# Patient Record
Sex: Male | Born: 1945 | Race: White | Hispanic: No | Marital: Married | Smoking: Never smoker
Health system: Southern US, Community
[De-identification: ages and names within clinical notes are randomized; demographics above are authoritative.]

## PROBLEM LIST (undated history)

## (undated) DIAGNOSIS — G20 Parkinson's disease: Secondary | ICD-10-CM

## (undated) DIAGNOSIS — I10 Essential (primary) hypertension: Secondary | ICD-10-CM

## (undated) DIAGNOSIS — G20A1 Parkinson's disease without dyskinesia, without mention of fluctuations: Secondary | ICD-10-CM

## (undated) HISTORY — PX: OTHER SURGICAL HISTORY: SHX169

## (undated) HISTORY — DX: Parkinson's disease: G20

## (undated) HISTORY — DX: Essential (primary) hypertension: I10

## (undated) HISTORY — PX: REPLACEMENT TOTAL KNEE BILATERAL: SUR1225

## (undated) HISTORY — PX: BILATERAL KNEE ARTHROSCOPY: SUR91

## (undated) HISTORY — DX: Parkinson's disease without dyskinesia, without mention of fluctuations: G20.A1

---

## 2010-07-08 ENCOUNTER — Encounter (HOSPITAL_COMMUNITY)
Admission: RE | Admit: 2010-07-08 | Discharge: 2010-07-08 | Disposition: A | Discharge status: Home / Self Care | Payer: Medicare Other | Source: Ambulatory Visit | Attending: Orthopedic Surgery | Admitting: Orthopedic Surgery

## 2010-07-08 ENCOUNTER — Other Ambulatory Visit (HOSPITAL_COMMUNITY): Payer: Self-pay | Admitting: Orthopedic Surgery

## 2010-07-08 DIAGNOSIS — M25552 Pain in left hip: Secondary | ICD-10-CM

## 2010-07-08 DIAGNOSIS — Z01818 Encounter for other preprocedural examination: Secondary | ICD-10-CM | POA: Insufficient documentation

## 2010-07-08 DIAGNOSIS — Z01812 Encounter for preprocedural laboratory examination: Secondary | ICD-10-CM | POA: Insufficient documentation

## 2010-07-08 LAB — URINALYSIS, ROUTINE W REFLEX MICROSCOPIC
Nitrite: NEGATIVE
Specific Gravity, Urine: 1.012 (ref 1.005–1.030)
Urobilinogen, UA: 0.2 mg/dL (ref 0.0–1.0)
pH: 6.5 (ref 5.0–8.0)

## 2010-07-08 LAB — COMPREHENSIVE METABOLIC PANEL
ALT: 20 U/L (ref 0–53)
AST: 18 U/L (ref 0–37)
Albumin: 4.2 g/dL (ref 3.5–5.2)
Calcium: 9.8 mg/dL (ref 8.4–10.5)
Creatinine, Ser: 0.99 mg/dL (ref 0.4–1.5)
GFR calc Af Amer: >60 mL/min (ref >60)
GFR calc non Af Amer: >60 mL/min (ref >60)
Sodium: 137 mEq/L (ref 135–145)
Total Protein: 7 g/dL (ref 6.0–8.3)

## 2010-07-08 LAB — CBC
MCV: 85.7 fL (ref 78.0–100.0)
Platelets: 289 10*3/uL (ref 150–400)
RBC: 4.9 MIL/uL (ref 4.22–5.81)
WBC: 7.4 10*3/uL (ref 4.0–10.5)

## 2010-07-08 LAB — DIFFERENTIAL
Eosinophils Absolute: 0.1 10*3/uL (ref 0.0–0.7)
Lymphs Abs: 1.7 10*3/uL (ref 0.7–4.0)
Neutrophils Relative %: 65 % (ref 43–77)

## 2010-07-08 LAB — TYPE AND SCREEN: ABO/RH(D): A POS

## 2010-07-08 LAB — PROTIME-INR: Prothrombin Time: 12.3 seconds (ref 11.6–15.2)

## 2010-07-08 LAB — SURGICAL PCR SCREEN: MRSA, PCR: NEGATIVE

## 2010-07-08 LAB — LIPID PANEL
Cholesterol: 217 mg/dL — ABNORMAL HIGH (ref 0–200)
HDL: 34 mg/dL — ABNORMAL LOW (ref >39)
LDL Cholesterol: 127 mg/dL — ABNORMAL HIGH (ref 0–99)
Triglycerides: 278 mg/dL — ABNORMAL HIGH (ref <150)

## 2010-07-08 LAB — APTT: aPTT: 28 seconds (ref 24–37)

## 2010-07-08 LAB — URINE MICROSCOPIC-ADD ON

## 2010-07-11 ENCOUNTER — Inpatient Hospital Stay (HOSPITAL_COMMUNITY)
Admission: RE | Admit: 2010-07-11 | Discharge: 2010-07-12 | DRG: 481 | Disposition: A | Discharge status: Home / Self Care | Payer: Medicare Other | Source: Ambulatory Visit | Attending: Orthopedic Surgery | Admitting: Orthopedic Surgery

## 2010-07-11 ENCOUNTER — Inpatient Hospital Stay (HOSPITAL_COMMUNITY): Payer: Medicare Other

## 2010-07-11 DIAGNOSIS — F3289 Other specified depressive episodes: Secondary | ICD-10-CM | POA: Diagnosis present

## 2010-07-11 DIAGNOSIS — T84498A Other mechanical complication of other internal orthopedic devices, implants and grafts, initial encounter: Principal | ICD-10-CM | POA: Diagnosis present

## 2010-07-11 DIAGNOSIS — F329 Major depressive disorder, single episode, unspecified: Secondary | ICD-10-CM | POA: Diagnosis present

## 2010-07-11 DIAGNOSIS — I10 Essential (primary) hypertension: Secondary | ICD-10-CM | POA: Diagnosis present

## 2010-07-11 DIAGNOSIS — Y838 Other surgical procedures as the cause of abnormal reaction of the patient, or of later complication, without mention of misadventure at the time of the procedure: Secondary | ICD-10-CM | POA: Diagnosis present

## 2010-07-11 DIAGNOSIS — G43909 Migraine, unspecified, not intractable, without status migrainosus: Secondary | ICD-10-CM | POA: Diagnosis present

## 2010-07-11 DIAGNOSIS — IMO0002 Reserved for concepts with insufficient information to code with codable children: Secondary | ICD-10-CM | POA: Diagnosis present

## 2010-07-11 LAB — CALCIUM, IONIZED: Calcium, Ion: 1.3 mmol/L (ref 1.12–1.32)

## 2010-07-11 LAB — GRAM STAIN

## 2010-07-12 LAB — VITAMIN D 25 HYDROXY (VIT D DEFICIENCY, FRACTURES): Vit D, 25-Hydroxy: 37 ng/mL (ref 30–89)

## 2010-07-12 LAB — BASIC METABOLIC PANEL
BUN: 7 mg/dL (ref 6–23)
CO2: 28 mEq/L (ref 19–32)
Calcium: 8.6 mg/dL (ref 8.4–10.5)
Glucose, Bld: 117 mg/dL — ABNORMAL HIGH (ref 70–99)
Sodium: 138 mEq/L (ref 135–145)

## 2010-07-12 LAB — CBC
HCT: 31.4 % — ABNORMAL LOW (ref 39.0–52.0)
Hemoglobin: 10.5 g/dL — ABNORMAL LOW (ref 13.0–17.0)
MCH: 28.8 pg (ref 26.0–34.0)
MCHC: 33.4 g/dL (ref 30.0–36.0)

## 2010-07-13 LAB — WOUND CULTURE

## 2010-07-16 LAB — ANAEROBIC CULTURE

## 2010-07-18 NOTE — Op Note (Signed)
[name], [name]                ACCOUNT NO.:  [id number]  MEDICAL RECORD NO.:  [id number]           PATIENT TYPE:  I  LOCATION:  5033                         FACILITY:  [facility]  PHYSICIAN:  Doralee Albino. Carola Frost, M.D. DATE OF BIRTH:  07-Mar-1946  DATE OF PROCEDURE:  07/11/2010 DATE OF DISCHARGE:                              OPERATIVE REPORT   PREOPERATIVE DIAGNOSES:  Left intertrochanteric nonunion, broken hardware.  POSTOPERATIVE DIAGNOSIS:  Left intertrochanteric nonunion, broken hardware.  PROCEDURE: 1. Open repair of left intertrochanteric nonunion with auto and     allografting. 2. IM nailing of the left femur and hip using a Synthes lateral entry     recon 13 mm x 400 mm statically locked. 3. Removal of broken implant, left femur.  SURGEON:  Doralee Albino. Carola Frost, MD  ASSISTANT:  Adrian Blackwater, RNFA  ANESTHESIA:  General.  THE FINDINGS:  Nonunion without gross instability.  SPECIMENS:  Two anaerobic, aerobic cultures from the nonunion site sent to micro.  FINDINGS:  No bacteria.  ESTIMATED BLOOD LOSS:  290 mL.  COMPLICATIONS:  None.  DISPOSITION:  PACU.  CONDITION:  Stable.  BRIEF SUMMARY AND INDICATIONS FOR PROCEDURE:  Matthew Morrow is a athletic 65 year old male status post and total knee arthroplasty who underwent repair of a left femoral neck fracture about 4 months ago.  He failed to progress and had acute exacerbation of pain about 3 weeks ago resulting in persistent elevated pain.  X-rays showed breakage of his proximal screw within the static portion of the short trochanteric nail.  There was also some windshield wipering or scalloping of the anterior cortex. We discussed with him the options for repair including conversion to a blade plate grafting alone, expected management, or conversion to a long nail, which would bypassed the area of damaged cortex, but could create a stress riser distally.  After full discussion, he did wish to proceed with the  lateral option understanding risks to include a supracondylar femur fracture, nerve injury, vessel injury, persistent nonunion of the hip, symptomatic hardware, DVT, PE, heart attack, stroke, and multiple others.  DESCRIPTION OF PROCEDURE:  Matthew Morrow was administered preoperative antibiotics and taken to the operating room where general anesthesia was induced.  He was positioned on the fracture table with his left leg outstretched, all prominences padded appropriately, the well leg in the elevated position.  The old incision was remade about the hip after confirming appropriate position on C-arm.  I did not use the entire proximal incision.  Dissection was carried down to the tip of the nail. The locking screw within the nail was partially withdrawn.  The lag screw engaged through the old incision more distal and partially withdrawn.  I then engaged the distal lock and withdrew that.  I then used another instrument as a tamp to slightly impact the screws so that the nail could be withdrawn.  Broken hardware did increase the difficulty and operative time required.  Furthermore, there was extensive bone overgrowth completely covering the lag screw into the femoral head laterally.  This was followed by eventual extraction and sequential reaming.  We passed the 13  without any chatter, but the higher but larger reamer heads encountered the screw.  Consequently, we selected a 13 nail x 400, so that it would go past the prosthesis at the knee and reduce the chance for fracture.  Prior to this after removal of the implant, we then extended the lag screw incision and more directly exposed the nonunion site.  Here, I collected extensive autograft from the lateral cortex including exuberant bone over the lag screw.  This was followed with extensive allografting into the lag screw hole using 80 mL of bone.  Graft was furthermore placed anteriorly and posteriorly within the canal checking on C-arm  and impacting it with a bone tamp all along the areas of nonunion.  Following this, attention was turned to nailing of the femur to add additional support.  Again as described above sequentially reamed and then the nail inserted carefully past the broken medial screw tip, passed down into the distal aspect of the knee making sure that the bow was appropriate to avoid fracture of the anterior cortex.  This was followed by placement of the two lag screws into the femoral head achieving outstanding purchase with the inferior one along the calcar and then a solid purchase with the more central screw.  I did partially encounter the old canal from the previous implant.  I did swing him into a significant valgus to improve the alignment at the hip to further increase biomechanical environment for healing.  Perfect circle technique was used to place the most distal screw in the static hole.  All wounds were irrigated thoroughly and closed in standard layered fashion again placing more allograft laterally around the area of previous lag screw that had to be removed. The patient was then awakened from anesthesia and transported to the PACU in stable condition following layered closure.  Adrian Blackwater, RNFA assisted me throughout the procedure.  PROGNOSIS:  Matthew Morrow will be weightbearing as tolerated on the left lower extremity was no motion, restrictions of the knee or hip.  We will add DVT prophylaxis while in the hospital and will be discharged on a short 10-day course.     Doralee Albino. Carola Frost, M.D.     [facility]MED[facility]  D:  07/11/2010  T:  07/12/2010  Job:  [id number]  Electronically Signed by Myrene Galas M.D. on 07/18/2010 07:54:00 AM

## 2011-06-03 DIAGNOSIS — D126 Benign neoplasm of colon, unspecified: Secondary | ICD-10-CM | POA: Diagnosis not present

## 2011-06-03 DIAGNOSIS — Z1211 Encounter for screening for malignant neoplasm of colon: Secondary | ICD-10-CM | POA: Diagnosis not present

## 2011-12-31 DIAGNOSIS — F329 Major depressive disorder, single episode, unspecified: Secondary | ICD-10-CM | POA: Diagnosis not present

## 2011-12-31 DIAGNOSIS — I10 Essential (primary) hypertension: Secondary | ICD-10-CM | POA: Diagnosis not present

## 2012-01-21 DIAGNOSIS — H9190 Unspecified hearing loss, unspecified ear: Secondary | ICD-10-CM | POA: Diagnosis not present

## 2012-01-23 DIAGNOSIS — H60399 Other infective otitis externa, unspecified ear: Secondary | ICD-10-CM | POA: Diagnosis not present

## 2012-01-23 DIAGNOSIS — H61309 Acquired stenosis of external ear canal, unspecified, unspecified ear: Secondary | ICD-10-CM | POA: Diagnosis not present

## 2012-01-23 DIAGNOSIS — H9209 Otalgia, unspecified ear: Secondary | ICD-10-CM | POA: Diagnosis not present

## 2012-02-06 DIAGNOSIS — H60399 Other infective otitis externa, unspecified ear: Secondary | ICD-10-CM | POA: Diagnosis not present

## 2012-02-06 DIAGNOSIS — H9209 Otalgia, unspecified ear: Secondary | ICD-10-CM | POA: Diagnosis not present

## 2012-04-30 DIAGNOSIS — G43709 Chronic migraine without aura, not intractable, without status migrainosus: Secondary | ICD-10-CM | POA: Diagnosis not present

## 2012-04-30 DIAGNOSIS — Z79899 Other long term (current) drug therapy: Secondary | ICD-10-CM | POA: Diagnosis not present

## 2012-04-30 DIAGNOSIS — Z125 Encounter for screening for malignant neoplasm of prostate: Secondary | ICD-10-CM | POA: Diagnosis not present

## 2012-04-30 DIAGNOSIS — I10 Essential (primary) hypertension: Secondary | ICD-10-CM | POA: Diagnosis not present

## 2012-04-30 DIAGNOSIS — M1990 Unspecified osteoarthritis, unspecified site: Secondary | ICD-10-CM | POA: Diagnosis not present

## 2012-12-10 DIAGNOSIS — F0280 Dementia in other diseases classified elsewhere without behavioral disturbance: Secondary | ICD-10-CM | POA: Diagnosis not present

## 2012-12-10 DIAGNOSIS — R259 Unspecified abnormal involuntary movements: Secondary | ICD-10-CM | POA: Diagnosis not present

## 2013-01-04 DIAGNOSIS — K645 Perianal venous thrombosis: Secondary | ICD-10-CM | POA: Diagnosis not present

## 2013-02-07 DIAGNOSIS — F0280 Dementia in other diseases classified elsewhere without behavioral disturbance: Secondary | ICD-10-CM | POA: Diagnosis not present

## 2013-02-07 DIAGNOSIS — R259 Unspecified abnormal involuntary movements: Secondary | ICD-10-CM | POA: Diagnosis not present

## 2013-02-09 DIAGNOSIS — Z23 Encounter for immunization: Secondary | ICD-10-CM | POA: Diagnosis not present

## 2013-03-25 DIAGNOSIS — I10 Essential (primary) hypertension: Secondary | ICD-10-CM | POA: Diagnosis not present

## 2013-03-25 DIAGNOSIS — M1990 Unspecified osteoarthritis, unspecified site: Secondary | ICD-10-CM | POA: Diagnosis not present

## 2013-03-25 DIAGNOSIS — Z0000 Encounter for general adult medical examination without abnormal findings: Secondary | ICD-10-CM | POA: Diagnosis not present

## 2013-05-20 DIAGNOSIS — I10 Essential (primary) hypertension: Secondary | ICD-10-CM | POA: Diagnosis not present

## 2013-05-20 DIAGNOSIS — Z125 Encounter for screening for malignant neoplasm of prostate: Secondary | ICD-10-CM | POA: Diagnosis not present

## 2013-05-20 DIAGNOSIS — D485 Neoplasm of uncertain behavior of skin: Secondary | ICD-10-CM | POA: Diagnosis not present

## 2013-05-20 DIAGNOSIS — G309 Alzheimer's disease, unspecified: Secondary | ICD-10-CM | POA: Diagnosis not present

## 2013-05-20 DIAGNOSIS — F0280 Dementia in other diseases classified elsewhere without behavioral disturbance: Secondary | ICD-10-CM | POA: Diagnosis not present

## 2013-05-20 DIAGNOSIS — E7800 Pure hypercholesterolemia, unspecified: Secondary | ICD-10-CM | POA: Diagnosis not present

## 2013-06-02 DIAGNOSIS — L570 Actinic keratosis: Secondary | ICD-10-CM | POA: Diagnosis not present

## 2013-06-02 DIAGNOSIS — C44320 Squamous cell carcinoma of skin of unspecified parts of face: Secondary | ICD-10-CM | POA: Diagnosis not present

## 2013-07-06 DIAGNOSIS — C44320 Squamous cell carcinoma of skin of unspecified parts of face: Secondary | ICD-10-CM | POA: Diagnosis not present

## 2013-07-12 DIAGNOSIS — M19019 Primary osteoarthritis, unspecified shoulder: Secondary | ICD-10-CM | POA: Diagnosis not present

## 2013-07-12 DIAGNOSIS — M25519 Pain in unspecified shoulder: Secondary | ICD-10-CM | POA: Diagnosis not present

## 2013-08-12 DIAGNOSIS — F0280 Dementia in other diseases classified elsewhere without behavioral disturbance: Secondary | ICD-10-CM | POA: Diagnosis not present

## 2013-08-12 DIAGNOSIS — G309 Alzheimer's disease, unspecified: Secondary | ICD-10-CM | POA: Diagnosis not present

## 2013-08-12 DIAGNOSIS — R259 Unspecified abnormal involuntary movements: Secondary | ICD-10-CM | POA: Diagnosis not present

## 2013-08-16 DIAGNOSIS — M19019 Primary osteoarthritis, unspecified shoulder: Secondary | ICD-10-CM | POA: Diagnosis not present

## 2013-09-29 DIAGNOSIS — G20 Parkinson's disease: Secondary | ICD-10-CM | POA: Diagnosis not present

## 2013-09-29 DIAGNOSIS — G3184 Mild cognitive impairment, so stated: Secondary | ICD-10-CM | POA: Diagnosis not present

## 2013-09-29 DIAGNOSIS — R413 Other amnesia: Secondary | ICD-10-CM | POA: Diagnosis not present

## 2013-10-11 DIAGNOSIS — L570 Actinic keratosis: Secondary | ICD-10-CM | POA: Diagnosis not present

## 2013-10-13 DIAGNOSIS — D518 Other vitamin B12 deficiency anemias: Secondary | ICD-10-CM | POA: Diagnosis not present

## 2013-10-16 DIAGNOSIS — G3184 Mild cognitive impairment, so stated: Secondary | ICD-10-CM | POA: Insufficient documentation

## 2013-10-16 DIAGNOSIS — R259 Unspecified abnormal involuntary movements: Secondary | ICD-10-CM

## 2013-10-16 DIAGNOSIS — I10 Essential (primary) hypertension: Secondary | ICD-10-CM

## 2013-10-16 HISTORY — DX: Essential (primary) hypertension: I10

## 2013-10-16 HISTORY — DX: Unspecified abnormal involuntary movements: R25.9

## 2013-10-16 HISTORY — DX: Mild cognitive impairment of uncertain or unknown etiology: G31.84

## 2013-10-20 DIAGNOSIS — D510 Vitamin B12 deficiency anemia due to intrinsic factor deficiency: Secondary | ICD-10-CM | POA: Diagnosis not present

## 2013-10-26 DIAGNOSIS — D518 Other vitamin B12 deficiency anemias: Secondary | ICD-10-CM | POA: Diagnosis not present

## 2013-11-01 DIAGNOSIS — M19019 Primary osteoarthritis, unspecified shoulder: Secondary | ICD-10-CM | POA: Diagnosis not present

## 2013-11-01 DIAGNOSIS — M25519 Pain in unspecified shoulder: Secondary | ICD-10-CM | POA: Diagnosis not present

## 2013-11-02 DIAGNOSIS — D518 Other vitamin B12 deficiency anemias: Secondary | ICD-10-CM | POA: Diagnosis not present

## 2013-11-09 DIAGNOSIS — D510 Vitamin B12 deficiency anemia due to intrinsic factor deficiency: Secondary | ICD-10-CM | POA: Diagnosis not present

## 2013-11-17 DIAGNOSIS — D510 Vitamin B12 deficiency anemia due to intrinsic factor deficiency: Secondary | ICD-10-CM | POA: Diagnosis not present

## 2013-11-30 DIAGNOSIS — D510 Vitamin B12 deficiency anemia due to intrinsic factor deficiency: Secondary | ICD-10-CM | POA: Diagnosis not present

## 2014-01-02 DIAGNOSIS — D510 Vitamin B12 deficiency anemia due to intrinsic factor deficiency: Secondary | ICD-10-CM | POA: Diagnosis not present

## 2014-01-19 DIAGNOSIS — D510 Vitamin B12 deficiency anemia due to intrinsic factor deficiency: Secondary | ICD-10-CM | POA: Diagnosis not present

## 2014-02-15 DIAGNOSIS — D510 Vitamin B12 deficiency anemia due to intrinsic factor deficiency: Secondary | ICD-10-CM | POA: Diagnosis not present

## 2014-02-28 DIAGNOSIS — Z23 Encounter for immunization: Secondary | ICD-10-CM | POA: Diagnosis not present

## 2014-02-28 DIAGNOSIS — D510 Vitamin B12 deficiency anemia due to intrinsic factor deficiency: Secondary | ICD-10-CM | POA: Diagnosis not present

## 2014-03-02 DIAGNOSIS — G20 Parkinson's disease: Secondary | ICD-10-CM | POA: Diagnosis not present

## 2014-03-02 DIAGNOSIS — G3184 Mild cognitive impairment, so stated: Secondary | ICD-10-CM | POA: Diagnosis not present

## 2014-03-29 DIAGNOSIS — D510 Vitamin B12 deficiency anemia due to intrinsic factor deficiency: Secondary | ICD-10-CM | POA: Diagnosis not present

## 2014-04-07 DIAGNOSIS — G20 Parkinson's disease: Secondary | ICD-10-CM | POA: Diagnosis not present

## 2014-04-07 DIAGNOSIS — G3184 Mild cognitive impairment, so stated: Secondary | ICD-10-CM | POA: Diagnosis not present

## 2014-04-11 DIAGNOSIS — C4442 Squamous cell carcinoma of skin of scalp and neck: Secondary | ICD-10-CM | POA: Diagnosis not present

## 2014-04-21 DIAGNOSIS — D510 Vitamin B12 deficiency anemia due to intrinsic factor deficiency: Secondary | ICD-10-CM | POA: Diagnosis not present

## 2014-04-28 DIAGNOSIS — G219 Secondary parkinsonism, unspecified: Secondary | ICD-10-CM | POA: Diagnosis not present

## 2014-05-17 DIAGNOSIS — D510 Vitamin B12 deficiency anemia due to intrinsic factor deficiency: Secondary | ICD-10-CM | POA: Diagnosis not present

## 2014-05-26 DIAGNOSIS — K529 Noninfective gastroenteritis and colitis, unspecified: Secondary | ICD-10-CM | POA: Diagnosis not present

## 2014-06-16 DIAGNOSIS — Z1211 Encounter for screening for malignant neoplasm of colon: Secondary | ICD-10-CM | POA: Diagnosis not present

## 2014-06-16 DIAGNOSIS — Z86010 Personal history of colonic polyps: Secondary | ICD-10-CM | POA: Diagnosis not present

## 2014-06-16 DIAGNOSIS — R194 Change in bowel habit: Secondary | ICD-10-CM | POA: Diagnosis not present

## 2014-06-22 DIAGNOSIS — D510 Vitamin B12 deficiency anemia due to intrinsic factor deficiency: Secondary | ICD-10-CM | POA: Diagnosis not present

## 2014-07-11 DIAGNOSIS — D510 Vitamin B12 deficiency anemia due to intrinsic factor deficiency: Secondary | ICD-10-CM | POA: Diagnosis not present

## 2014-07-28 DIAGNOSIS — D510 Vitamin B12 deficiency anemia due to intrinsic factor deficiency: Secondary | ICD-10-CM | POA: Diagnosis not present

## 2014-08-16 DIAGNOSIS — M19019 Primary osteoarthritis, unspecified shoulder: Secondary | ICD-10-CM | POA: Diagnosis not present

## 2014-08-18 DIAGNOSIS — D510 Vitamin B12 deficiency anemia due to intrinsic factor deficiency: Secondary | ICD-10-CM | POA: Diagnosis not present

## 2014-08-21 DIAGNOSIS — R5383 Other fatigue: Secondary | ICD-10-CM | POA: Diagnosis not present

## 2014-08-21 DIAGNOSIS — D509 Iron deficiency anemia, unspecified: Secondary | ICD-10-CM | POA: Diagnosis not present

## 2014-08-21 DIAGNOSIS — I10 Essential (primary) hypertension: Secondary | ICD-10-CM | POA: Diagnosis not present

## 2014-08-21 DIAGNOSIS — Z125 Encounter for screening for malignant neoplasm of prostate: Secondary | ICD-10-CM | POA: Diagnosis not present

## 2014-08-21 DIAGNOSIS — F419 Anxiety disorder, unspecified: Secondary | ICD-10-CM | POA: Diagnosis not present

## 2014-08-21 DIAGNOSIS — E780 Pure hypercholesterolemia: Secondary | ICD-10-CM | POA: Diagnosis not present

## 2014-08-21 DIAGNOSIS — D510 Vitamin B12 deficiency anemia due to intrinsic factor deficiency: Secondary | ICD-10-CM | POA: Diagnosis not present

## 2014-08-21 DIAGNOSIS — Z79899 Other long term (current) drug therapy: Secondary | ICD-10-CM | POA: Diagnosis not present

## 2014-09-20 DIAGNOSIS — D510 Vitamin B12 deficiency anemia due to intrinsic factor deficiency: Secondary | ICD-10-CM | POA: Diagnosis not present

## 2014-09-20 DIAGNOSIS — M1990 Unspecified osteoarthritis, unspecified site: Secondary | ICD-10-CM | POA: Diagnosis not present

## 2014-09-20 DIAGNOSIS — G309 Alzheimer's disease, unspecified: Secondary | ICD-10-CM | POA: Diagnosis not present

## 2014-09-20 DIAGNOSIS — F329 Major depressive disorder, single episode, unspecified: Secondary | ICD-10-CM | POA: Diagnosis not present

## 2014-09-22 DIAGNOSIS — D510 Vitamin B12 deficiency anemia due to intrinsic factor deficiency: Secondary | ICD-10-CM | POA: Diagnosis not present

## 2014-10-10 DIAGNOSIS — L570 Actinic keratosis: Secondary | ICD-10-CM | POA: Diagnosis not present

## 2014-10-20 DIAGNOSIS — D510 Vitamin B12 deficiency anemia due to intrinsic factor deficiency: Secondary | ICD-10-CM | POA: Diagnosis not present

## 2014-10-21 DIAGNOSIS — I10 Essential (primary) hypertension: Secondary | ICD-10-CM | POA: Diagnosis not present

## 2014-10-21 DIAGNOSIS — E569 Vitamin deficiency, unspecified: Secondary | ICD-10-CM | POA: Diagnosis not present

## 2014-10-21 DIAGNOSIS — E780 Pure hypercholesterolemia: Secondary | ICD-10-CM | POA: Diagnosis not present

## 2014-10-21 DIAGNOSIS — F329 Major depressive disorder, single episode, unspecified: Secondary | ICD-10-CM | POA: Diagnosis not present

## 2014-11-20 DIAGNOSIS — E569 Vitamin deficiency, unspecified: Secondary | ICD-10-CM | POA: Diagnosis not present

## 2014-11-20 DIAGNOSIS — F329 Major depressive disorder, single episode, unspecified: Secondary | ICD-10-CM | POA: Diagnosis not present

## 2014-11-20 DIAGNOSIS — E780 Pure hypercholesterolemia: Secondary | ICD-10-CM | POA: Diagnosis not present

## 2014-11-20 DIAGNOSIS — F0390 Unspecified dementia without behavioral disturbance: Secondary | ICD-10-CM | POA: Diagnosis not present

## 2014-11-21 DIAGNOSIS — D510 Vitamin B12 deficiency anemia due to intrinsic factor deficiency: Secondary | ICD-10-CM | POA: Diagnosis not present

## 2014-12-15 DIAGNOSIS — D510 Vitamin B12 deficiency anemia due to intrinsic factor deficiency: Secondary | ICD-10-CM | POA: Diagnosis not present

## 2014-12-21 DIAGNOSIS — E569 Vitamin deficiency, unspecified: Secondary | ICD-10-CM | POA: Diagnosis not present

## 2014-12-21 DIAGNOSIS — D649 Anemia, unspecified: Secondary | ICD-10-CM | POA: Diagnosis not present

## 2014-12-21 DIAGNOSIS — N400 Enlarged prostate without lower urinary tract symptoms: Secondary | ICD-10-CM | POA: Diagnosis not present

## 2015-01-05 DIAGNOSIS — D510 Vitamin B12 deficiency anemia due to intrinsic factor deficiency: Secondary | ICD-10-CM | POA: Diagnosis not present

## 2015-02-23 DIAGNOSIS — Z23 Encounter for immunization: Secondary | ICD-10-CM | POA: Diagnosis not present

## 2015-02-23 DIAGNOSIS — D510 Vitamin B12 deficiency anemia due to intrinsic factor deficiency: Secondary | ICD-10-CM | POA: Diagnosis not present

## 2015-03-23 DIAGNOSIS — F0390 Unspecified dementia without behavioral disturbance: Secondary | ICD-10-CM | POA: Diagnosis not present

## 2015-03-23 DIAGNOSIS — D510 Vitamin B12 deficiency anemia due to intrinsic factor deficiency: Secondary | ICD-10-CM | POA: Diagnosis not present

## 2015-03-23 DIAGNOSIS — F329 Major depressive disorder, single episode, unspecified: Secondary | ICD-10-CM | POA: Diagnosis not present

## 2015-03-23 DIAGNOSIS — E785 Hyperlipidemia, unspecified: Secondary | ICD-10-CM | POA: Diagnosis not present

## 2015-03-23 DIAGNOSIS — E569 Vitamin deficiency, unspecified: Secondary | ICD-10-CM | POA: Diagnosis not present

## 2015-04-22 DIAGNOSIS — E569 Vitamin deficiency, unspecified: Secondary | ICD-10-CM | POA: Diagnosis not present

## 2015-04-22 DIAGNOSIS — E785 Hyperlipidemia, unspecified: Secondary | ICD-10-CM | POA: Diagnosis not present

## 2015-04-22 DIAGNOSIS — F0390 Unspecified dementia without behavioral disturbance: Secondary | ICD-10-CM | POA: Diagnosis not present

## 2015-04-22 DIAGNOSIS — F329 Major depressive disorder, single episode, unspecified: Secondary | ICD-10-CM | POA: Diagnosis not present

## 2015-04-23 DIAGNOSIS — M19011 Primary osteoarthritis, right shoulder: Secondary | ICD-10-CM | POA: Diagnosis not present

## 2015-04-23 DIAGNOSIS — M19012 Primary osteoarthritis, left shoulder: Secondary | ICD-10-CM | POA: Diagnosis not present

## 2015-05-04 DIAGNOSIS — Z0000 Encounter for general adult medical examination without abnormal findings: Secondary | ICD-10-CM | POA: Diagnosis not present

## 2015-05-04 DIAGNOSIS — Z1389 Encounter for screening for other disorder: Secondary | ICD-10-CM | POA: Diagnosis not present

## 2015-05-04 DIAGNOSIS — Z23 Encounter for immunization: Secondary | ICD-10-CM | POA: Diagnosis not present

## 2015-05-04 DIAGNOSIS — I10 Essential (primary) hypertension: Secondary | ICD-10-CM | POA: Diagnosis not present

## 2015-05-04 DIAGNOSIS — Z9181 History of falling: Secondary | ICD-10-CM | POA: Diagnosis not present

## 2015-05-04 DIAGNOSIS — D510 Vitamin B12 deficiency anemia due to intrinsic factor deficiency: Secondary | ICD-10-CM | POA: Diagnosis not present

## 2015-05-25 DIAGNOSIS — D510 Vitamin B12 deficiency anemia due to intrinsic factor deficiency: Secondary | ICD-10-CM | POA: Diagnosis not present

## 2015-06-18 DIAGNOSIS — Z9181 History of falling: Secondary | ICD-10-CM | POA: Diagnosis not present

## 2015-06-18 DIAGNOSIS — G20 Parkinson's disease: Secondary | ICD-10-CM | POA: Diagnosis not present

## 2015-06-18 DIAGNOSIS — I10 Essential (primary) hypertension: Secondary | ICD-10-CM | POA: Diagnosis not present

## 2015-06-18 DIAGNOSIS — Z882 Allergy status to sulfonamides status: Secondary | ICD-10-CM | POA: Diagnosis not present

## 2015-06-18 DIAGNOSIS — Z7982 Long term (current) use of aspirin: Secondary | ICD-10-CM | POA: Diagnosis not present

## 2015-06-18 DIAGNOSIS — R258 Other abnormal involuntary movements: Secondary | ICD-10-CM | POA: Diagnosis not present

## 2015-06-18 DIAGNOSIS — F4310 Post-traumatic stress disorder, unspecified: Secondary | ICD-10-CM | POA: Diagnosis not present

## 2015-06-18 DIAGNOSIS — Z79899 Other long term (current) drug therapy: Secondary | ICD-10-CM | POA: Diagnosis not present

## 2015-06-18 DIAGNOSIS — R251 Tremor, unspecified: Secondary | ICD-10-CM | POA: Diagnosis not present

## 2015-06-26 DIAGNOSIS — D510 Vitamin B12 deficiency anemia due to intrinsic factor deficiency: Secondary | ICD-10-CM | POA: Diagnosis not present

## 2015-07-19 DIAGNOSIS — M7051 Other bursitis of knee, right knee: Secondary | ICD-10-CM | POA: Diagnosis not present

## 2015-07-19 DIAGNOSIS — M19011 Primary osteoarthritis, right shoulder: Secondary | ICD-10-CM | POA: Diagnosis not present

## 2015-08-15 DIAGNOSIS — D510 Vitamin B12 deficiency anemia due to intrinsic factor deficiency: Secondary | ICD-10-CM | POA: Diagnosis not present

## 2015-08-21 DIAGNOSIS — R259 Unspecified abnormal involuntary movements: Secondary | ICD-10-CM

## 2015-08-21 DIAGNOSIS — R1310 Dysphagia, unspecified: Secondary | ICD-10-CM | POA: Diagnosis not present

## 2015-08-21 DIAGNOSIS — R29818 Other symptoms and signs involving the nervous system: Secondary | ICD-10-CM

## 2015-08-21 DIAGNOSIS — G20 Parkinson's disease: Secondary | ICD-10-CM | POA: Diagnosis not present

## 2015-08-21 HISTORY — DX: Unspecified abnormal involuntary movements: R25.9

## 2015-08-21 HISTORY — DX: Other symptoms and signs involving the nervous system: R29.818

## 2015-09-27 DIAGNOSIS — M19012 Primary osteoarthritis, left shoulder: Secondary | ICD-10-CM | POA: Diagnosis not present

## 2015-09-27 DIAGNOSIS — D510 Vitamin B12 deficiency anemia due to intrinsic factor deficiency: Secondary | ICD-10-CM | POA: Diagnosis not present

## 2015-10-04 DIAGNOSIS — M25562 Pain in left knee: Secondary | ICD-10-CM | POA: Diagnosis not present

## 2015-10-09 DIAGNOSIS — M50322 Other cervical disc degeneration at C5-C6 level: Secondary | ICD-10-CM | POA: Diagnosis not present

## 2015-10-09 DIAGNOSIS — R633 Feeding difficulties: Secondary | ICD-10-CM | POA: Diagnosis not present

## 2015-10-09 DIAGNOSIS — R1310 Dysphagia, unspecified: Secondary | ICD-10-CM | POA: Diagnosis not present

## 2015-10-18 DIAGNOSIS — M25562 Pain in left knee: Secondary | ICD-10-CM | POA: Diagnosis not present

## 2015-10-22 DIAGNOSIS — M25562 Pain in left knee: Secondary | ICD-10-CM | POA: Diagnosis not present

## 2015-10-22 DIAGNOSIS — M25462 Effusion, left knee: Secondary | ICD-10-CM | POA: Diagnosis not present

## 2015-10-22 DIAGNOSIS — Z96652 Presence of left artificial knee joint: Secondary | ICD-10-CM | POA: Diagnosis not present

## 2015-11-15 DIAGNOSIS — D510 Vitamin B12 deficiency anemia due to intrinsic factor deficiency: Secondary | ICD-10-CM | POA: Diagnosis not present

## 2015-11-21 DIAGNOSIS — M19012 Primary osteoarthritis, left shoulder: Secondary | ICD-10-CM | POA: Diagnosis not present

## 2015-11-21 DIAGNOSIS — M19011 Primary osteoarthritis, right shoulder: Secondary | ICD-10-CM | POA: Diagnosis not present

## 2015-11-23 DIAGNOSIS — M19012 Primary osteoarthritis, left shoulder: Secondary | ICD-10-CM | POA: Diagnosis not present

## 2015-12-06 DIAGNOSIS — M19012 Primary osteoarthritis, left shoulder: Secondary | ICD-10-CM | POA: Diagnosis not present

## 2015-12-07 DIAGNOSIS — D510 Vitamin B12 deficiency anemia due to intrinsic factor deficiency: Secondary | ICD-10-CM | POA: Diagnosis not present

## 2015-12-12 DIAGNOSIS — Z79899 Other long term (current) drug therapy: Secondary | ICD-10-CM | POA: Diagnosis not present

## 2015-12-12 DIAGNOSIS — E785 Hyperlipidemia, unspecified: Secondary | ICD-10-CM | POA: Diagnosis not present

## 2015-12-12 DIAGNOSIS — Z125 Encounter for screening for malignant neoplasm of prostate: Secondary | ICD-10-CM | POA: Diagnosis not present

## 2015-12-24 DIAGNOSIS — Z0000 Encounter for general adult medical examination without abnormal findings: Secondary | ICD-10-CM | POA: Diagnosis not present

## 2015-12-24 DIAGNOSIS — I10 Essential (primary) hypertension: Secondary | ICD-10-CM | POA: Diagnosis not present

## 2015-12-24 DIAGNOSIS — G20 Parkinson's disease: Secondary | ICD-10-CM | POA: Diagnosis not present

## 2015-12-24 DIAGNOSIS — F0280 Dementia in other diseases classified elsewhere without behavioral disturbance: Secondary | ICD-10-CM | POA: Diagnosis not present

## 2016-01-04 DIAGNOSIS — M7522 Bicipital tendinitis, left shoulder: Secondary | ICD-10-CM | POA: Diagnosis not present

## 2016-01-04 DIAGNOSIS — F329 Major depressive disorder, single episode, unspecified: Secondary | ICD-10-CM | POA: Diagnosis not present

## 2016-01-04 DIAGNOSIS — I10 Essential (primary) hypertension: Secondary | ICD-10-CM | POA: Diagnosis not present

## 2016-01-04 DIAGNOSIS — Z79899 Other long term (current) drug therapy: Secondary | ICD-10-CM | POA: Diagnosis not present

## 2016-01-04 DIAGNOSIS — G8918 Other acute postprocedural pain: Secondary | ICD-10-CM | POA: Diagnosis not present

## 2016-01-04 DIAGNOSIS — S43432A Superior glenoid labrum lesion of left shoulder, initial encounter: Secondary | ICD-10-CM | POA: Diagnosis not present

## 2016-01-04 DIAGNOSIS — M19012 Primary osteoarthritis, left shoulder: Secondary | ICD-10-CM | POA: Diagnosis not present

## 2016-01-04 DIAGNOSIS — M7582 Other shoulder lesions, left shoulder: Secondary | ICD-10-CM | POA: Diagnosis not present

## 2016-01-04 DIAGNOSIS — E7800 Pure hypercholesterolemia, unspecified: Secondary | ICD-10-CM | POA: Diagnosis not present

## 2016-01-07 DIAGNOSIS — M25612 Stiffness of left shoulder, not elsewhere classified: Secondary | ICD-10-CM | POA: Diagnosis not present

## 2016-01-07 DIAGNOSIS — M19012 Primary osteoarthritis, left shoulder: Secondary | ICD-10-CM | POA: Diagnosis not present

## 2016-01-07 DIAGNOSIS — M6281 Muscle weakness (generalized): Secondary | ICD-10-CM | POA: Diagnosis not present

## 2016-01-10 DIAGNOSIS — M19012 Primary osteoarthritis, left shoulder: Secondary | ICD-10-CM | POA: Diagnosis not present

## 2016-01-10 DIAGNOSIS — M25612 Stiffness of left shoulder, not elsewhere classified: Secondary | ICD-10-CM | POA: Diagnosis not present

## 2016-01-10 DIAGNOSIS — M6281 Muscle weakness (generalized): Secondary | ICD-10-CM | POA: Diagnosis not present

## 2016-01-15 DIAGNOSIS — M6281 Muscle weakness (generalized): Secondary | ICD-10-CM | POA: Diagnosis not present

## 2016-01-15 DIAGNOSIS — M19012 Primary osteoarthritis, left shoulder: Secondary | ICD-10-CM | POA: Diagnosis not present

## 2016-01-15 DIAGNOSIS — M25612 Stiffness of left shoulder, not elsewhere classified: Secondary | ICD-10-CM | POA: Diagnosis not present

## 2016-01-18 DIAGNOSIS — M19012 Primary osteoarthritis, left shoulder: Secondary | ICD-10-CM | POA: Diagnosis not present

## 2016-01-18 DIAGNOSIS — M6281 Muscle weakness (generalized): Secondary | ICD-10-CM | POA: Diagnosis not present

## 2016-01-18 DIAGNOSIS — M25612 Stiffness of left shoulder, not elsewhere classified: Secondary | ICD-10-CM | POA: Diagnosis not present

## 2016-01-24 DIAGNOSIS — M25612 Stiffness of left shoulder, not elsewhere classified: Secondary | ICD-10-CM | POA: Diagnosis not present

## 2016-01-24 DIAGNOSIS — M19012 Primary osteoarthritis, left shoulder: Secondary | ICD-10-CM | POA: Diagnosis not present

## 2016-01-24 DIAGNOSIS — M6281 Muscle weakness (generalized): Secondary | ICD-10-CM | POA: Diagnosis not present

## 2016-02-15 DIAGNOSIS — Z23 Encounter for immunization: Secondary | ICD-10-CM | POA: Diagnosis not present

## 2016-02-15 DIAGNOSIS — D510 Vitamin B12 deficiency anemia due to intrinsic factor deficiency: Secondary | ICD-10-CM | POA: Diagnosis not present

## 2016-02-27 DIAGNOSIS — L570 Actinic keratosis: Secondary | ICD-10-CM | POA: Diagnosis not present

## 2016-03-03 DIAGNOSIS — M19011 Primary osteoarthritis, right shoulder: Secondary | ICD-10-CM | POA: Diagnosis not present

## 2016-03-03 DIAGNOSIS — M19012 Primary osteoarthritis, left shoulder: Secondary | ICD-10-CM | POA: Diagnosis not present

## 2016-03-28 DIAGNOSIS — Z79899 Other long term (current) drug therapy: Secondary | ICD-10-CM | POA: Diagnosis not present

## 2016-03-28 DIAGNOSIS — Z882 Allergy status to sulfonamides status: Secondary | ICD-10-CM | POA: Diagnosis not present

## 2016-03-28 DIAGNOSIS — D510 Vitamin B12 deficiency anemia due to intrinsic factor deficiency: Secondary | ICD-10-CM | POA: Diagnosis not present

## 2016-03-28 DIAGNOSIS — G20 Parkinson's disease: Secondary | ICD-10-CM | POA: Diagnosis not present

## 2016-03-28 DIAGNOSIS — I10 Essential (primary) hypertension: Secondary | ICD-10-CM | POA: Diagnosis not present

## 2016-03-28 DIAGNOSIS — Z7982 Long term (current) use of aspirin: Secondary | ICD-10-CM | POA: Diagnosis not present

## 2016-05-01 DIAGNOSIS — M19011 Primary osteoarthritis, right shoulder: Secondary | ICD-10-CM | POA: Diagnosis not present

## 2016-05-07 DIAGNOSIS — M25511 Pain in right shoulder: Secondary | ICD-10-CM | POA: Diagnosis not present

## 2016-05-07 DIAGNOSIS — M19011 Primary osteoarthritis, right shoulder: Secondary | ICD-10-CM | POA: Diagnosis not present

## 2016-05-14 DIAGNOSIS — E559 Vitamin D deficiency, unspecified: Secondary | ICD-10-CM | POA: Diagnosis not present

## 2016-05-14 DIAGNOSIS — Z01810 Encounter for preprocedural cardiovascular examination: Secondary | ICD-10-CM | POA: Diagnosis not present

## 2016-05-14 DIAGNOSIS — Z01818 Encounter for other preprocedural examination: Secondary | ICD-10-CM | POA: Diagnosis not present

## 2016-05-14 DIAGNOSIS — M79609 Pain in unspecified limb: Secondary | ICD-10-CM | POA: Diagnosis not present

## 2016-05-14 DIAGNOSIS — Z79899 Other long term (current) drug therapy: Secondary | ICD-10-CM | POA: Diagnosis not present

## 2016-05-14 DIAGNOSIS — R52 Pain, unspecified: Secondary | ICD-10-CM | POA: Diagnosis not present

## 2016-05-28 DIAGNOSIS — M19011 Primary osteoarthritis, right shoulder: Secondary | ICD-10-CM | POA: Diagnosis not present

## 2016-05-28 DIAGNOSIS — Z4901 Encounter for fitting and adjustment of extracorporeal dialysis catheter: Secondary | ICD-10-CM | POA: Diagnosis not present

## 2016-06-03 DIAGNOSIS — F329 Major depressive disorder, single episode, unspecified: Secondary | ICD-10-CM | POA: Diagnosis present

## 2016-06-03 DIAGNOSIS — R338 Other retention of urine: Secondary | ICD-10-CM | POA: Diagnosis not present

## 2016-06-03 DIAGNOSIS — R339 Retention of urine, unspecified: Secondary | ICD-10-CM | POA: Diagnosis not present

## 2016-06-03 DIAGNOSIS — Z79899 Other long term (current) drug therapy: Secondary | ICD-10-CM | POA: Diagnosis not present

## 2016-06-03 DIAGNOSIS — E7800 Pure hypercholesterolemia, unspecified: Secondary | ICD-10-CM | POA: Diagnosis present

## 2016-06-03 DIAGNOSIS — M19011 Primary osteoarthritis, right shoulder: Secondary | ICD-10-CM | POA: Diagnosis not present

## 2016-06-03 DIAGNOSIS — I10 Essential (primary) hypertension: Secondary | ICD-10-CM | POA: Diagnosis present

## 2016-06-03 DIAGNOSIS — Z471 Aftercare following joint replacement surgery: Secondary | ICD-10-CM | POA: Diagnosis not present

## 2016-06-03 DIAGNOSIS — Z882 Allergy status to sulfonamides status: Secondary | ICD-10-CM | POA: Diagnosis not present

## 2016-06-03 DIAGNOSIS — G8918 Other acute postprocedural pain: Secondary | ICD-10-CM | POA: Diagnosis not present

## 2016-06-03 DIAGNOSIS — E559 Vitamin D deficiency, unspecified: Secondary | ICD-10-CM | POA: Diagnosis present

## 2016-06-03 DIAGNOSIS — Z7982 Long term (current) use of aspirin: Secondary | ICD-10-CM | POA: Diagnosis not present

## 2016-06-03 DIAGNOSIS — Z96611 Presence of right artificial shoulder joint: Secondary | ICD-10-CM | POA: Diagnosis not present

## 2016-06-09 DIAGNOSIS — R338 Other retention of urine: Secondary | ICD-10-CM | POA: Diagnosis not present

## 2016-06-09 DIAGNOSIS — N401 Enlarged prostate with lower urinary tract symptoms: Secondary | ICD-10-CM | POA: Diagnosis not present

## 2016-06-12 DIAGNOSIS — Z96611 Presence of right artificial shoulder joint: Secondary | ICD-10-CM | POA: Diagnosis not present

## 2016-06-16 DIAGNOSIS — R351 Nocturia: Secondary | ICD-10-CM | POA: Diagnosis not present

## 2016-06-16 DIAGNOSIS — R338 Other retention of urine: Secondary | ICD-10-CM | POA: Diagnosis not present

## 2016-06-16 DIAGNOSIS — N401 Enlarged prostate with lower urinary tract symptoms: Secondary | ICD-10-CM | POA: Diagnosis not present

## 2016-06-16 DIAGNOSIS — N3020 Other chronic cystitis without hematuria: Secondary | ICD-10-CM | POA: Diagnosis not present

## 2016-06-19 DIAGNOSIS — M25611 Stiffness of right shoulder, not elsewhere classified: Secondary | ICD-10-CM | POA: Diagnosis not present

## 2016-06-19 DIAGNOSIS — M25511 Pain in right shoulder: Secondary | ICD-10-CM | POA: Diagnosis not present

## 2016-06-30 DIAGNOSIS — M25411 Effusion, right shoulder: Secondary | ICD-10-CM | POA: Diagnosis not present

## 2016-06-30 DIAGNOSIS — M25511 Pain in right shoulder: Secondary | ICD-10-CM | POA: Diagnosis not present

## 2016-06-30 DIAGNOSIS — Z96611 Presence of right artificial shoulder joint: Secondary | ICD-10-CM | POA: Diagnosis not present

## 2016-06-30 DIAGNOSIS — M62511 Muscle wasting and atrophy, not elsewhere classified, right shoulder: Secondary | ICD-10-CM | POA: Diagnosis not present

## 2016-07-04 DIAGNOSIS — M25411 Effusion, right shoulder: Secondary | ICD-10-CM | POA: Diagnosis not present

## 2016-07-04 DIAGNOSIS — Z96611 Presence of right artificial shoulder joint: Secondary | ICD-10-CM | POA: Diagnosis not present

## 2016-07-04 DIAGNOSIS — M25511 Pain in right shoulder: Secondary | ICD-10-CM | POA: Diagnosis not present

## 2016-07-04 DIAGNOSIS — M62511 Muscle wasting and atrophy, not elsewhere classified, right shoulder: Secondary | ICD-10-CM | POA: Diagnosis not present

## 2016-07-07 DIAGNOSIS — M25411 Effusion, right shoulder: Secondary | ICD-10-CM | POA: Diagnosis not present

## 2016-07-07 DIAGNOSIS — Z96611 Presence of right artificial shoulder joint: Secondary | ICD-10-CM | POA: Diagnosis not present

## 2016-07-07 DIAGNOSIS — M62511 Muscle wasting and atrophy, not elsewhere classified, right shoulder: Secondary | ICD-10-CM | POA: Diagnosis not present

## 2016-07-07 DIAGNOSIS — M25511 Pain in right shoulder: Secondary | ICD-10-CM | POA: Diagnosis not present

## 2016-07-11 DIAGNOSIS — Z96611 Presence of right artificial shoulder joint: Secondary | ICD-10-CM | POA: Diagnosis not present

## 2016-07-11 DIAGNOSIS — M25511 Pain in right shoulder: Secondary | ICD-10-CM | POA: Diagnosis not present

## 2016-07-11 DIAGNOSIS — M62511 Muscle wasting and atrophy, not elsewhere classified, right shoulder: Secondary | ICD-10-CM | POA: Diagnosis not present

## 2016-07-11 DIAGNOSIS — M25411 Effusion, right shoulder: Secondary | ICD-10-CM | POA: Diagnosis not present

## 2016-07-14 DIAGNOSIS — Z96611 Presence of right artificial shoulder joint: Secondary | ICD-10-CM | POA: Diagnosis not present

## 2016-07-14 DIAGNOSIS — M25411 Effusion, right shoulder: Secondary | ICD-10-CM | POA: Diagnosis not present

## 2016-07-14 DIAGNOSIS — M25511 Pain in right shoulder: Secondary | ICD-10-CM | POA: Diagnosis not present

## 2016-07-14 DIAGNOSIS — M62511 Muscle wasting and atrophy, not elsewhere classified, right shoulder: Secondary | ICD-10-CM | POA: Diagnosis not present

## 2016-07-17 DIAGNOSIS — M62511 Muscle wasting and atrophy, not elsewhere classified, right shoulder: Secondary | ICD-10-CM | POA: Diagnosis not present

## 2016-07-17 DIAGNOSIS — Z96611 Presence of right artificial shoulder joint: Secondary | ICD-10-CM | POA: Diagnosis not present

## 2016-07-17 DIAGNOSIS — M25411 Effusion, right shoulder: Secondary | ICD-10-CM | POA: Diagnosis not present

## 2016-07-17 DIAGNOSIS — M25511 Pain in right shoulder: Secondary | ICD-10-CM | POA: Diagnosis not present

## 2016-07-21 DIAGNOSIS — M62511 Muscle wasting and atrophy, not elsewhere classified, right shoulder: Secondary | ICD-10-CM | POA: Diagnosis not present

## 2016-07-21 DIAGNOSIS — Z96611 Presence of right artificial shoulder joint: Secondary | ICD-10-CM | POA: Diagnosis not present

## 2016-07-21 DIAGNOSIS — M25511 Pain in right shoulder: Secondary | ICD-10-CM | POA: Diagnosis not present

## 2016-07-21 DIAGNOSIS — M25411 Effusion, right shoulder: Secondary | ICD-10-CM | POA: Diagnosis not present

## 2016-07-23 DIAGNOSIS — M62511 Muscle wasting and atrophy, not elsewhere classified, right shoulder: Secondary | ICD-10-CM | POA: Diagnosis not present

## 2016-07-23 DIAGNOSIS — M25411 Effusion, right shoulder: Secondary | ICD-10-CM | POA: Diagnosis not present

## 2016-07-23 DIAGNOSIS — Z96611 Presence of right artificial shoulder joint: Secondary | ICD-10-CM | POA: Diagnosis not present

## 2016-07-23 DIAGNOSIS — M25511 Pain in right shoulder: Secondary | ICD-10-CM | POA: Diagnosis not present

## 2016-07-28 DIAGNOSIS — Z96611 Presence of right artificial shoulder joint: Secondary | ICD-10-CM | POA: Diagnosis not present

## 2016-07-28 DIAGNOSIS — M25511 Pain in right shoulder: Secondary | ICD-10-CM | POA: Diagnosis not present

## 2016-07-28 DIAGNOSIS — M62511 Muscle wasting and atrophy, not elsewhere classified, right shoulder: Secondary | ICD-10-CM | POA: Diagnosis not present

## 2016-07-28 DIAGNOSIS — M25411 Effusion, right shoulder: Secondary | ICD-10-CM | POA: Diagnosis not present

## 2016-07-29 DIAGNOSIS — D510 Vitamin B12 deficiency anemia due to intrinsic factor deficiency: Secondary | ICD-10-CM | POA: Diagnosis not present

## 2016-07-30 DIAGNOSIS — Z96611 Presence of right artificial shoulder joint: Secondary | ICD-10-CM | POA: Diagnosis not present

## 2016-07-30 DIAGNOSIS — M62511 Muscle wasting and atrophy, not elsewhere classified, right shoulder: Secondary | ICD-10-CM | POA: Diagnosis not present

## 2016-07-30 DIAGNOSIS — M25511 Pain in right shoulder: Secondary | ICD-10-CM | POA: Diagnosis not present

## 2016-07-30 DIAGNOSIS — M25411 Effusion, right shoulder: Secondary | ICD-10-CM | POA: Diagnosis not present

## 2016-08-01 DIAGNOSIS — M62511 Muscle wasting and atrophy, not elsewhere classified, right shoulder: Secondary | ICD-10-CM | POA: Diagnosis not present

## 2016-08-01 DIAGNOSIS — M25411 Effusion, right shoulder: Secondary | ICD-10-CM | POA: Diagnosis not present

## 2016-08-01 DIAGNOSIS — M25511 Pain in right shoulder: Secondary | ICD-10-CM | POA: Diagnosis not present

## 2016-08-01 DIAGNOSIS — I10 Essential (primary) hypertension: Secondary | ICD-10-CM | POA: Diagnosis not present

## 2016-08-01 DIAGNOSIS — G20 Parkinson's disease: Secondary | ICD-10-CM | POA: Diagnosis not present

## 2016-08-01 DIAGNOSIS — Z96611 Presence of right artificial shoulder joint: Secondary | ICD-10-CM | POA: Diagnosis not present

## 2016-08-01 DIAGNOSIS — Z9181 History of falling: Secondary | ICD-10-CM | POA: Diagnosis not present

## 2016-08-01 DIAGNOSIS — Z6824 Body mass index (BMI) 24.0-24.9, adult: Secondary | ICD-10-CM | POA: Diagnosis not present

## 2016-08-04 DIAGNOSIS — Z96611 Presence of right artificial shoulder joint: Secondary | ICD-10-CM | POA: Diagnosis not present

## 2016-08-04 DIAGNOSIS — M25411 Effusion, right shoulder: Secondary | ICD-10-CM | POA: Diagnosis not present

## 2016-08-04 DIAGNOSIS — M25511 Pain in right shoulder: Secondary | ICD-10-CM | POA: Diagnosis not present

## 2016-08-04 DIAGNOSIS — M62511 Muscle wasting and atrophy, not elsewhere classified, right shoulder: Secondary | ICD-10-CM | POA: Diagnosis not present

## 2016-08-08 DIAGNOSIS — M25511 Pain in right shoulder: Secondary | ICD-10-CM | POA: Diagnosis not present

## 2016-08-08 DIAGNOSIS — M62511 Muscle wasting and atrophy, not elsewhere classified, right shoulder: Secondary | ICD-10-CM | POA: Diagnosis not present

## 2016-08-08 DIAGNOSIS — Z96611 Presence of right artificial shoulder joint: Secondary | ICD-10-CM | POA: Diagnosis not present

## 2016-08-08 DIAGNOSIS — M25411 Effusion, right shoulder: Secondary | ICD-10-CM | POA: Diagnosis not present

## 2016-08-11 DIAGNOSIS — M25511 Pain in right shoulder: Secondary | ICD-10-CM | POA: Diagnosis not present

## 2016-08-11 DIAGNOSIS — M25411 Effusion, right shoulder: Secondary | ICD-10-CM | POA: Diagnosis not present

## 2016-08-11 DIAGNOSIS — Z96611 Presence of right artificial shoulder joint: Secondary | ICD-10-CM | POA: Diagnosis not present

## 2016-08-11 DIAGNOSIS — M62511 Muscle wasting and atrophy, not elsewhere classified, right shoulder: Secondary | ICD-10-CM | POA: Diagnosis not present

## 2016-08-26 DIAGNOSIS — M25411 Effusion, right shoulder: Secondary | ICD-10-CM | POA: Diagnosis not present

## 2016-08-26 DIAGNOSIS — Z96611 Presence of right artificial shoulder joint: Secondary | ICD-10-CM | POA: Diagnosis not present

## 2016-08-26 DIAGNOSIS — M25511 Pain in right shoulder: Secondary | ICD-10-CM | POA: Diagnosis not present

## 2016-08-26 DIAGNOSIS — M62511 Muscle wasting and atrophy, not elsewhere classified, right shoulder: Secondary | ICD-10-CM | POA: Diagnosis not present

## 2016-09-02 DIAGNOSIS — D510 Vitamin B12 deficiency anemia due to intrinsic factor deficiency: Secondary | ICD-10-CM | POA: Diagnosis not present

## 2016-09-11 DIAGNOSIS — M7062 Trochanteric bursitis, left hip: Secondary | ICD-10-CM | POA: Diagnosis not present

## 2016-09-11 DIAGNOSIS — M533 Sacrococcygeal disorders, not elsewhere classified: Secondary | ICD-10-CM | POA: Diagnosis not present

## 2016-09-15 DIAGNOSIS — N401 Enlarged prostate with lower urinary tract symptoms: Secondary | ICD-10-CM | POA: Diagnosis not present

## 2016-09-15 DIAGNOSIS — R338 Other retention of urine: Secondary | ICD-10-CM | POA: Diagnosis not present

## 2016-09-15 DIAGNOSIS — N3020 Other chronic cystitis without hematuria: Secondary | ICD-10-CM | POA: Diagnosis not present

## 2016-09-15 DIAGNOSIS — R351 Nocturia: Secondary | ICD-10-CM | POA: Diagnosis not present

## 2016-10-16 DIAGNOSIS — D510 Vitamin B12 deficiency anemia due to intrinsic factor deficiency: Secondary | ICD-10-CM | POA: Diagnosis not present

## 2016-12-17 DIAGNOSIS — M7741 Metatarsalgia, right foot: Secondary | ICD-10-CM | POA: Diagnosis not present

## 2016-12-17 DIAGNOSIS — D510 Vitamin B12 deficiency anemia due to intrinsic factor deficiency: Secondary | ICD-10-CM | POA: Diagnosis not present

## 2017-01-12 DIAGNOSIS — I10 Essential (primary) hypertension: Secondary | ICD-10-CM | POA: Diagnosis not present

## 2017-01-12 DIAGNOSIS — F419 Anxiety disorder, unspecified: Secondary | ICD-10-CM | POA: Diagnosis not present

## 2017-01-12 DIAGNOSIS — Z13220 Encounter for screening for lipoid disorders: Secondary | ICD-10-CM | POA: Diagnosis not present

## 2017-01-12 DIAGNOSIS — D510 Vitamin B12 deficiency anemia due to intrinsic factor deficiency: Secondary | ICD-10-CM | POA: Diagnosis not present

## 2017-01-12 DIAGNOSIS — Z0000 Encounter for general adult medical examination without abnormal findings: Secondary | ICD-10-CM | POA: Diagnosis not present

## 2017-01-12 DIAGNOSIS — M67449 Ganglion, unspecified hand: Secondary | ICD-10-CM | POA: Diagnosis not present

## 2017-01-12 DIAGNOSIS — Z6824 Body mass index (BMI) 24.0-24.9, adult: Secondary | ICD-10-CM | POA: Diagnosis not present

## 2017-01-12 DIAGNOSIS — N486 Induration penis plastica: Secondary | ICD-10-CM | POA: Diagnosis not present

## 2017-02-23 DIAGNOSIS — D510 Vitamin B12 deficiency anemia due to intrinsic factor deficiency: Secondary | ICD-10-CM | POA: Diagnosis not present

## 2017-03-09 DIAGNOSIS — L299 Pruritus, unspecified: Secondary | ICD-10-CM | POA: Diagnosis not present

## 2017-03-09 DIAGNOSIS — L219 Seborrheic dermatitis, unspecified: Secondary | ICD-10-CM | POA: Diagnosis not present

## 2017-03-09 DIAGNOSIS — L820 Inflamed seborrheic keratosis: Secondary | ICD-10-CM | POA: Diagnosis not present

## 2017-03-31 DIAGNOSIS — D510 Vitamin B12 deficiency anemia due to intrinsic factor deficiency: Secondary | ICD-10-CM | POA: Diagnosis not present

## 2017-04-16 DIAGNOSIS — R4189 Other symptoms and signs involving cognitive functions and awareness: Secondary | ICD-10-CM | POA: Diagnosis not present

## 2017-04-16 DIAGNOSIS — G20 Parkinson's disease: Secondary | ICD-10-CM | POA: Diagnosis not present

## 2017-04-16 DIAGNOSIS — Z79899 Other long term (current) drug therapy: Secondary | ICD-10-CM | POA: Diagnosis not present

## 2017-04-16 DIAGNOSIS — R110 Nausea: Secondary | ICD-10-CM | POA: Diagnosis not present

## 2017-04-21 DIAGNOSIS — D510 Vitamin B12 deficiency anemia due to intrinsic factor deficiency: Secondary | ICD-10-CM | POA: Diagnosis not present

## 2017-04-30 DIAGNOSIS — F419 Anxiety disorder, unspecified: Secondary | ICD-10-CM | POA: Diagnosis not present

## 2017-04-30 DIAGNOSIS — Z6824 Body mass index (BMI) 24.0-24.9, adult: Secondary | ICD-10-CM | POA: Diagnosis not present

## 2017-04-30 DIAGNOSIS — Z125 Encounter for screening for malignant neoplasm of prostate: Secondary | ICD-10-CM | POA: Diagnosis not present

## 2017-04-30 DIAGNOSIS — I10 Essential (primary) hypertension: Secondary | ICD-10-CM | POA: Diagnosis not present

## 2017-04-30 DIAGNOSIS — D510 Vitamin B12 deficiency anemia due to intrinsic factor deficiency: Secondary | ICD-10-CM | POA: Diagnosis not present

## 2017-05-29 DIAGNOSIS — D510 Vitamin B12 deficiency anemia due to intrinsic factor deficiency: Secondary | ICD-10-CM | POA: Diagnosis not present

## 2017-06-24 DIAGNOSIS — D510 Vitamin B12 deficiency anemia due to intrinsic factor deficiency: Secondary | ICD-10-CM | POA: Diagnosis not present

## 2017-07-16 DIAGNOSIS — Z79899 Other long term (current) drug therapy: Secondary | ICD-10-CM | POA: Diagnosis not present

## 2017-07-16 DIAGNOSIS — G20 Parkinson's disease: Secondary | ICD-10-CM | POA: Diagnosis not present

## 2017-07-16 DIAGNOSIS — Z882 Allergy status to sulfonamides status: Secondary | ICD-10-CM | POA: Diagnosis not present

## 2017-07-28 DIAGNOSIS — D510 Vitamin B12 deficiency anemia due to intrinsic factor deficiency: Secondary | ICD-10-CM | POA: Diagnosis not present

## 2017-08-24 DIAGNOSIS — D510 Vitamin B12 deficiency anemia due to intrinsic factor deficiency: Secondary | ICD-10-CM | POA: Diagnosis not present

## 2017-09-30 DIAGNOSIS — D510 Vitamin B12 deficiency anemia due to intrinsic factor deficiency: Secondary | ICD-10-CM | POA: Diagnosis not present

## 2017-11-27 DIAGNOSIS — Z79899 Other long term (current) drug therapy: Secondary | ICD-10-CM | POA: Diagnosis not present

## 2017-11-27 DIAGNOSIS — Z1339 Encounter for screening examination for other mental health and behavioral disorders: Secondary | ICD-10-CM | POA: Diagnosis not present

## 2017-11-27 DIAGNOSIS — E559 Vitamin D deficiency, unspecified: Secondary | ICD-10-CM | POA: Diagnosis not present

## 2017-11-27 DIAGNOSIS — E785 Hyperlipidemia, unspecified: Secondary | ICD-10-CM | POA: Diagnosis not present

## 2017-11-27 DIAGNOSIS — Z6824 Body mass index (BMI) 24.0-24.9, adult: Secondary | ICD-10-CM | POA: Diagnosis not present

## 2017-11-27 DIAGNOSIS — Z9181 History of falling: Secondary | ICD-10-CM | POA: Diagnosis not present

## 2017-11-27 DIAGNOSIS — I10 Essential (primary) hypertension: Secondary | ICD-10-CM | POA: Diagnosis not present

## 2017-12-07 DIAGNOSIS — R972 Elevated prostate specific antigen [PSA]: Secondary | ICD-10-CM | POA: Diagnosis not present

## 2017-12-22 DIAGNOSIS — L570 Actinic keratosis: Secondary | ICD-10-CM | POA: Diagnosis not present

## 2017-12-22 DIAGNOSIS — L821 Other seborrheic keratosis: Secondary | ICD-10-CM | POA: Diagnosis not present

## 2017-12-22 DIAGNOSIS — B079 Viral wart, unspecified: Secondary | ICD-10-CM | POA: Diagnosis not present

## 2017-12-22 DIAGNOSIS — L820 Inflamed seborrheic keratosis: Secondary | ICD-10-CM | POA: Diagnosis not present

## 2017-12-22 DIAGNOSIS — L578 Other skin changes due to chronic exposure to nonionizing radiation: Secondary | ICD-10-CM | POA: Diagnosis not present

## 2017-12-28 DIAGNOSIS — D510 Vitamin B12 deficiency anemia due to intrinsic factor deficiency: Secondary | ICD-10-CM | POA: Diagnosis not present

## 2018-02-08 DIAGNOSIS — M7060 Trochanteric bursitis, unspecified hip: Secondary | ICD-10-CM | POA: Diagnosis not present

## 2018-02-08 DIAGNOSIS — M25552 Pain in left hip: Secondary | ICD-10-CM | POA: Diagnosis not present

## 2018-02-15 DIAGNOSIS — D510 Vitamin B12 deficiency anemia due to intrinsic factor deficiency: Secondary | ICD-10-CM | POA: Diagnosis not present

## 2018-02-15 DIAGNOSIS — Z23 Encounter for immunization: Secondary | ICD-10-CM | POA: Diagnosis not present

## 2018-02-23 DIAGNOSIS — Z23 Encounter for immunization: Secondary | ICD-10-CM | POA: Diagnosis not present

## 2018-03-25 DIAGNOSIS — M19012 Primary osteoarthritis, left shoulder: Secondary | ICD-10-CM | POA: Diagnosis not present

## 2018-04-21 DIAGNOSIS — D510 Vitamin B12 deficiency anemia due to intrinsic factor deficiency: Secondary | ICD-10-CM | POA: Diagnosis not present

## 2018-04-22 DIAGNOSIS — R4189 Other symptoms and signs involving cognitive functions and awareness: Secondary | ICD-10-CM | POA: Diagnosis not present

## 2018-04-22 DIAGNOSIS — D509 Iron deficiency anemia, unspecified: Secondary | ICD-10-CM | POA: Diagnosis not present

## 2018-04-22 DIAGNOSIS — R0683 Snoring: Secondary | ICD-10-CM | POA: Diagnosis not present

## 2018-04-22 DIAGNOSIS — G20 Parkinson's disease: Secondary | ICD-10-CM | POA: Diagnosis not present

## 2018-04-22 DIAGNOSIS — R5383 Other fatigue: Secondary | ICD-10-CM | POA: Diagnosis not present

## 2018-04-22 DIAGNOSIS — Z79899 Other long term (current) drug therapy: Secondary | ICD-10-CM | POA: Diagnosis not present

## 2018-04-22 DIAGNOSIS — R252 Cramp and spasm: Secondary | ICD-10-CM | POA: Diagnosis not present

## 2018-05-05 DIAGNOSIS — J329 Chronic sinusitis, unspecified: Secondary | ICD-10-CM | POA: Diagnosis not present

## 2018-05-05 DIAGNOSIS — Z6825 Body mass index (BMI) 25.0-25.9, adult: Secondary | ICD-10-CM | POA: Diagnosis not present

## 2018-05-06 DIAGNOSIS — G4710 Hypersomnia, unspecified: Secondary | ICD-10-CM | POA: Diagnosis not present

## 2018-05-06 DIAGNOSIS — R400 Somnolence: Secondary | ICD-10-CM | POA: Diagnosis not present

## 2018-05-06 DIAGNOSIS — R0683 Snoring: Secondary | ICD-10-CM | POA: Diagnosis not present

## 2018-05-07 DIAGNOSIS — R0683 Snoring: Secondary | ICD-10-CM | POA: Diagnosis not present

## 2018-05-07 DIAGNOSIS — G4710 Hypersomnia, unspecified: Secondary | ICD-10-CM | POA: Diagnosis not present

## 2018-05-07 DIAGNOSIS — G479 Sleep disorder, unspecified: Secondary | ICD-10-CM | POA: Diagnosis not present

## 2018-05-26 DIAGNOSIS — D510 Vitamin B12 deficiency anemia due to intrinsic factor deficiency: Secondary | ICD-10-CM | POA: Diagnosis not present

## 2018-05-26 DIAGNOSIS — M19012 Primary osteoarthritis, left shoulder: Secondary | ICD-10-CM | POA: Diagnosis not present

## 2018-07-08 DIAGNOSIS — Z6824 Body mass index (BMI) 24.0-24.9, adult: Secondary | ICD-10-CM | POA: Diagnosis not present

## 2018-07-08 DIAGNOSIS — Z0000 Encounter for general adult medical examination without abnormal findings: Secondary | ICD-10-CM | POA: Diagnosis not present

## 2018-07-08 DIAGNOSIS — G20 Parkinson's disease: Secondary | ICD-10-CM | POA: Diagnosis not present

## 2018-07-08 DIAGNOSIS — Z1331 Encounter for screening for depression: Secondary | ICD-10-CM | POA: Diagnosis not present

## 2018-07-08 DIAGNOSIS — Z125 Encounter for screening for malignant neoplasm of prostate: Secondary | ICD-10-CM | POA: Diagnosis not present

## 2018-07-08 DIAGNOSIS — F0280 Dementia in other diseases classified elsewhere without behavioral disturbance: Secondary | ICD-10-CM | POA: Diagnosis not present

## 2018-07-08 DIAGNOSIS — D510 Vitamin B12 deficiency anemia due to intrinsic factor deficiency: Secondary | ICD-10-CM | POA: Diagnosis not present

## 2018-07-08 DIAGNOSIS — K219 Gastro-esophageal reflux disease without esophagitis: Secondary | ICD-10-CM | POA: Diagnosis not present

## 2018-07-08 DIAGNOSIS — M1990 Unspecified osteoarthritis, unspecified site: Secondary | ICD-10-CM | POA: Diagnosis not present

## 2018-07-08 DIAGNOSIS — F419 Anxiety disorder, unspecified: Secondary | ICD-10-CM | POA: Diagnosis not present

## 2018-08-04 DIAGNOSIS — L570 Actinic keratosis: Secondary | ICD-10-CM | POA: Diagnosis not present

## 2018-08-04 DIAGNOSIS — L814 Other melanin hyperpigmentation: Secondary | ICD-10-CM | POA: Diagnosis not present

## 2018-08-04 DIAGNOSIS — D2239 Melanocytic nevi of other parts of face: Secondary | ICD-10-CM | POA: Diagnosis not present

## 2018-08-04 DIAGNOSIS — L821 Other seborrheic keratosis: Secondary | ICD-10-CM | POA: Diagnosis not present

## 2018-08-04 DIAGNOSIS — D225 Melanocytic nevi of trunk: Secondary | ICD-10-CM | POA: Diagnosis not present

## 2018-09-27 DIAGNOSIS — L570 Actinic keratosis: Secondary | ICD-10-CM | POA: Diagnosis not present

## 2018-09-27 DIAGNOSIS — L820 Inflamed seborrheic keratosis: Secondary | ICD-10-CM | POA: Diagnosis not present

## 2018-09-27 DIAGNOSIS — L209 Atopic dermatitis, unspecified: Secondary | ICD-10-CM | POA: Diagnosis not present

## 2018-10-06 ENCOUNTER — Other Ambulatory Visit: Payer: Self-pay

## 2018-10-06 NOTE — Patient Outreach (Signed)
[facility]) Care Management  10/06/2018  [name] 1946/04/14 [id number]   Patient screened for HTA Health Risk Assessment.  Requested Advance Directive documentation to be mailed. Advance Directive packet and EMMI mailed today. Will follow up within the next month to ensure receipt.   Ronn Melena, BSW Social Worker [phone]

## 2018-10-14 DIAGNOSIS — D519 Vitamin B12 deficiency anemia, unspecified: Secondary | ICD-10-CM | POA: Diagnosis not present

## 2018-10-16 DIAGNOSIS — K219 Gastro-esophageal reflux disease without esophagitis: Secondary | ICD-10-CM | POA: Diagnosis not present

## 2018-10-16 DIAGNOSIS — G20 Parkinson's disease: Secondary | ICD-10-CM | POA: Diagnosis not present

## 2018-11-03 DIAGNOSIS — G20 Parkinson's disease: Secondary | ICD-10-CM | POA: Diagnosis not present

## 2018-11-16 ENCOUNTER — Other Ambulatory Visit: Payer: Self-pay

## 2018-11-16 DIAGNOSIS — D519 Vitamin B12 deficiency anemia, unspecified: Secondary | ICD-10-CM | POA: Diagnosis not present

## 2018-11-16 DIAGNOSIS — K219 Gastro-esophageal reflux disease without esophagitis: Secondary | ICD-10-CM | POA: Diagnosis not present

## 2018-11-16 NOTE — Patient Outreach (Signed)
[facility]) Care Management  11/16/2018  DEANDREW HOECKER May 06, 1946 [id number]  [facility] BSW closing case due to patient receiving other CM services.   Ronn Melena, BSW Social Worker [phone]

## 2018-12-17 DIAGNOSIS — K219 Gastro-esophageal reflux disease without esophagitis: Secondary | ICD-10-CM | POA: Diagnosis not present

## 2018-12-17 DIAGNOSIS — D519 Vitamin B12 deficiency anemia, unspecified: Secondary | ICD-10-CM | POA: Diagnosis not present

## 2018-12-17 DIAGNOSIS — I10 Essential (primary) hypertension: Secondary | ICD-10-CM | POA: Diagnosis not present

## 2018-12-20 ENCOUNTER — Other Ambulatory Visit: Payer: Self-pay

## 2019-01-10 DIAGNOSIS — D519 Vitamin B12 deficiency anemia, unspecified: Secondary | ICD-10-CM | POA: Diagnosis not present

## 2019-01-17 DIAGNOSIS — I10 Essential (primary) hypertension: Secondary | ICD-10-CM | POA: Diagnosis not present

## 2019-01-17 DIAGNOSIS — D519 Vitamin B12 deficiency anemia, unspecified: Secondary | ICD-10-CM | POA: Diagnosis not present

## 2019-01-27 DIAGNOSIS — N451 Epididymitis: Secondary | ICD-10-CM | POA: Diagnosis not present

## 2019-01-27 DIAGNOSIS — N318 Other neuromuscular dysfunction of bladder: Secondary | ICD-10-CM | POA: Diagnosis not present

## 2019-01-27 DIAGNOSIS — N3020 Other chronic cystitis without hematuria: Secondary | ICD-10-CM | POA: Diagnosis not present

## 2019-02-10 DIAGNOSIS — N3020 Other chronic cystitis without hematuria: Secondary | ICD-10-CM | POA: Diagnosis not present

## 2019-02-10 DIAGNOSIS — N451 Epididymitis: Secondary | ICD-10-CM | POA: Diagnosis not present

## 2019-02-16 DIAGNOSIS — K219 Gastro-esophageal reflux disease without esophagitis: Secondary | ICD-10-CM | POA: Diagnosis not present

## 2019-02-16 DIAGNOSIS — I10 Essential (primary) hypertension: Secondary | ICD-10-CM | POA: Diagnosis not present

## 2019-02-18 DIAGNOSIS — D519 Vitamin B12 deficiency anemia, unspecified: Secondary | ICD-10-CM | POA: Diagnosis not present

## 2019-02-18 DIAGNOSIS — Z23 Encounter for immunization: Secondary | ICD-10-CM | POA: Diagnosis not present

## 2019-03-07 DIAGNOSIS — N318 Other neuromuscular dysfunction of bladder: Secondary | ICD-10-CM | POA: Diagnosis not present

## 2019-03-07 DIAGNOSIS — N451 Epididymitis: Secondary | ICD-10-CM | POA: Diagnosis not present

## 2019-03-07 DIAGNOSIS — N3020 Other chronic cystitis without hematuria: Secondary | ICD-10-CM | POA: Diagnosis not present

## 2019-03-18 DIAGNOSIS — I10 Essential (primary) hypertension: Secondary | ICD-10-CM | POA: Diagnosis not present

## 2019-03-18 DIAGNOSIS — K219 Gastro-esophageal reflux disease without esophagitis: Secondary | ICD-10-CM | POA: Diagnosis not present

## 2019-03-21 DIAGNOSIS — K219 Gastro-esophageal reflux disease without esophagitis: Secondary | ICD-10-CM | POA: Diagnosis not present

## 2019-03-21 DIAGNOSIS — J40 Bronchitis, not specified as acute or chronic: Secondary | ICD-10-CM | POA: Diagnosis not present

## 2019-03-21 DIAGNOSIS — J329 Chronic sinusitis, unspecified: Secondary | ICD-10-CM | POA: Diagnosis not present

## 2019-04-12 DIAGNOSIS — Z79899 Other long term (current) drug therapy: Secondary | ICD-10-CM | POA: Diagnosis not present

## 2019-04-18 DIAGNOSIS — K219 Gastro-esophageal reflux disease without esophagitis: Secondary | ICD-10-CM | POA: Diagnosis not present

## 2019-04-18 DIAGNOSIS — M1990 Unspecified osteoarthritis, unspecified site: Secondary | ICD-10-CM | POA: Diagnosis not present

## 2019-04-18 DIAGNOSIS — I10 Essential (primary) hypertension: Secondary | ICD-10-CM | POA: Diagnosis not present

## 2019-05-09 DIAGNOSIS — Z79899 Other long term (current) drug therapy: Secondary | ICD-10-CM | POA: Diagnosis not present

## 2019-05-09 DIAGNOSIS — G20 Parkinson's disease: Secondary | ICD-10-CM | POA: Diagnosis not present

## 2019-05-09 DIAGNOSIS — F329 Major depressive disorder, single episode, unspecified: Secondary | ICD-10-CM | POA: Diagnosis not present

## 2019-05-19 DIAGNOSIS — D519 Vitamin B12 deficiency anemia, unspecified: Secondary | ICD-10-CM | POA: Diagnosis not present

## 2019-05-19 DIAGNOSIS — K219 Gastro-esophageal reflux disease without esophagitis: Secondary | ICD-10-CM | POA: Diagnosis not present

## 2019-05-19 DIAGNOSIS — I10 Essential (primary) hypertension: Secondary | ICD-10-CM | POA: Diagnosis not present

## 2019-06-02 DIAGNOSIS — D510 Vitamin B12 deficiency anemia due to intrinsic factor deficiency: Secondary | ICD-10-CM | POA: Diagnosis not present

## 2019-06-18 DIAGNOSIS — E782 Mixed hyperlipidemia: Secondary | ICD-10-CM | POA: Diagnosis not present

## 2019-06-18 DIAGNOSIS — K219 Gastro-esophageal reflux disease without esophagitis: Secondary | ICD-10-CM | POA: Diagnosis not present

## 2019-06-18 DIAGNOSIS — I10 Essential (primary) hypertension: Secondary | ICD-10-CM | POA: Diagnosis not present

## 2019-07-11 DIAGNOSIS — I10 Essential (primary) hypertension: Secondary | ICD-10-CM | POA: Diagnosis not present

## 2019-07-11 DIAGNOSIS — Z0000 Encounter for general adult medical examination without abnormal findings: Secondary | ICD-10-CM | POA: Diagnosis not present

## 2019-07-11 DIAGNOSIS — F0280 Dementia in other diseases classified elsewhere without behavioral disturbance: Secondary | ICD-10-CM | POA: Diagnosis not present

## 2019-07-11 DIAGNOSIS — Z79899 Other long term (current) drug therapy: Secondary | ICD-10-CM | POA: Diagnosis not present

## 2019-07-11 DIAGNOSIS — M1990 Unspecified osteoarthritis, unspecified site: Secondary | ICD-10-CM | POA: Diagnosis not present

## 2019-07-11 DIAGNOSIS — K219 Gastro-esophageal reflux disease without esophagitis: Secondary | ICD-10-CM | POA: Diagnosis not present

## 2019-07-11 DIAGNOSIS — N400 Enlarged prostate without lower urinary tract symptoms: Secondary | ICD-10-CM | POA: Diagnosis not present

## 2019-07-11 DIAGNOSIS — G20 Parkinson's disease: Secondary | ICD-10-CM | POA: Diagnosis not present

## 2019-07-11 DIAGNOSIS — Z6824 Body mass index (BMI) 24.0-24.9, adult: Secondary | ICD-10-CM | POA: Diagnosis not present

## 2019-07-11 DIAGNOSIS — Z23 Encounter for immunization: Secondary | ICD-10-CM | POA: Diagnosis not present

## 2019-07-11 DIAGNOSIS — Z13220 Encounter for screening for lipoid disorders: Secondary | ICD-10-CM | POA: Diagnosis not present

## 2019-07-11 DIAGNOSIS — F419 Anxiety disorder, unspecified: Secondary | ICD-10-CM | POA: Diagnosis not present

## 2019-07-17 DIAGNOSIS — I10 Essential (primary) hypertension: Secondary | ICD-10-CM | POA: Diagnosis not present

## 2019-07-17 DIAGNOSIS — K219 Gastro-esophageal reflux disease without esophagitis: Secondary | ICD-10-CM | POA: Diagnosis not present

## 2019-08-10 DIAGNOSIS — G20 Parkinson's disease: Secondary | ICD-10-CM | POA: Diagnosis not present

## 2019-08-10 DIAGNOSIS — G3184 Mild cognitive impairment, so stated: Secondary | ICD-10-CM | POA: Diagnosis not present

## 2019-08-10 DIAGNOSIS — Z9181 History of falling: Secondary | ICD-10-CM | POA: Diagnosis not present

## 2019-08-10 DIAGNOSIS — Z79899 Other long term (current) drug therapy: Secondary | ICD-10-CM | POA: Diagnosis not present

## 2019-08-10 DIAGNOSIS — Z5181 Encounter for therapeutic drug level monitoring: Secondary | ICD-10-CM | POA: Diagnosis not present

## 2019-08-10 DIAGNOSIS — R001 Bradycardia, unspecified: Secondary | ICD-10-CM | POA: Diagnosis not present

## 2019-08-10 DIAGNOSIS — T50905A Adverse effect of unspecified drugs, medicaments and biological substances, initial encounter: Secondary | ICD-10-CM | POA: Diagnosis not present

## 2019-08-10 DIAGNOSIS — R112 Nausea with vomiting, unspecified: Secondary | ICD-10-CM | POA: Diagnosis not present

## 2019-08-10 DIAGNOSIS — R2689 Other abnormalities of gait and mobility: Secondary | ICD-10-CM | POA: Diagnosis not present

## 2019-08-22 DIAGNOSIS — D510 Vitamin B12 deficiency anemia due to intrinsic factor deficiency: Secondary | ICD-10-CM | POA: Diagnosis not present

## 2019-10-05 DIAGNOSIS — D122 Benign neoplasm of ascending colon: Secondary | ICD-10-CM | POA: Diagnosis not present

## 2019-10-05 DIAGNOSIS — Z86010 Personal history of colonic polyps: Secondary | ICD-10-CM | POA: Diagnosis not present

## 2019-10-05 DIAGNOSIS — Z1211 Encounter for screening for malignant neoplasm of colon: Secondary | ICD-10-CM | POA: Diagnosis not present

## 2019-10-05 DIAGNOSIS — D126 Benign neoplasm of colon, unspecified: Secondary | ICD-10-CM | POA: Diagnosis not present

## 2019-10-05 DIAGNOSIS — K635 Polyp of colon: Secondary | ICD-10-CM | POA: Diagnosis not present

## 2019-10-17 DIAGNOSIS — M1990 Unspecified osteoarthritis, unspecified site: Secondary | ICD-10-CM | POA: Diagnosis not present

## 2019-10-17 DIAGNOSIS — K219 Gastro-esophageal reflux disease without esophagitis: Secondary | ICD-10-CM | POA: Diagnosis not present

## 2019-10-17 DIAGNOSIS — I10 Essential (primary) hypertension: Secondary | ICD-10-CM | POA: Diagnosis not present

## 2019-10-17 DIAGNOSIS — D519 Vitamin B12 deficiency anemia, unspecified: Secondary | ICD-10-CM | POA: Diagnosis not present

## 2019-10-21 DIAGNOSIS — D510 Vitamin B12 deficiency anemia due to intrinsic factor deficiency: Secondary | ICD-10-CM | POA: Diagnosis not present

## 2019-11-16 DIAGNOSIS — D519 Vitamin B12 deficiency anemia, unspecified: Secondary | ICD-10-CM | POA: Diagnosis not present

## 2019-11-16 DIAGNOSIS — I10 Essential (primary) hypertension: Secondary | ICD-10-CM | POA: Diagnosis not present

## 2019-11-16 DIAGNOSIS — M1990 Unspecified osteoarthritis, unspecified site: Secondary | ICD-10-CM | POA: Diagnosis not present

## 2019-11-16 DIAGNOSIS — K219 Gastro-esophageal reflux disease without esophagitis: Secondary | ICD-10-CM | POA: Diagnosis not present

## 2019-12-17 DIAGNOSIS — M1990 Unspecified osteoarthritis, unspecified site: Secondary | ICD-10-CM | POA: Diagnosis not present

## 2019-12-17 DIAGNOSIS — I10 Essential (primary) hypertension: Secondary | ICD-10-CM | POA: Diagnosis not present

## 2019-12-17 DIAGNOSIS — K219 Gastro-esophageal reflux disease without esophagitis: Secondary | ICD-10-CM | POA: Diagnosis not present

## 2019-12-17 DIAGNOSIS — D519 Vitamin B12 deficiency anemia, unspecified: Secondary | ICD-10-CM | POA: Diagnosis not present

## 2020-01-02 DIAGNOSIS — E538 Deficiency of other specified B group vitamins: Secondary | ICD-10-CM | POA: Diagnosis not present

## 2020-01-09 DIAGNOSIS — M19079 Primary osteoarthritis, unspecified ankle and foot: Secondary | ICD-10-CM

## 2020-01-09 DIAGNOSIS — G8929 Other chronic pain: Secondary | ICD-10-CM | POA: Diagnosis not present

## 2020-01-09 DIAGNOSIS — M79671 Pain in right foot: Secondary | ICD-10-CM | POA: Diagnosis not present

## 2020-01-09 HISTORY — DX: Primary osteoarthritis, unspecified ankle and foot: M19.079

## 2020-01-17 DIAGNOSIS — K219 Gastro-esophageal reflux disease without esophagitis: Secondary | ICD-10-CM | POA: Diagnosis not present

## 2020-01-17 DIAGNOSIS — M1990 Unspecified osteoarthritis, unspecified site: Secondary | ICD-10-CM | POA: Diagnosis not present

## 2020-01-17 DIAGNOSIS — D519 Vitamin B12 deficiency anemia, unspecified: Secondary | ICD-10-CM | POA: Diagnosis not present

## 2020-01-17 DIAGNOSIS — I10 Essential (primary) hypertension: Secondary | ICD-10-CM | POA: Diagnosis not present

## 2020-02-16 DIAGNOSIS — D519 Vitamin B12 deficiency anemia, unspecified: Secondary | ICD-10-CM | POA: Diagnosis not present

## 2020-02-16 DIAGNOSIS — K219 Gastro-esophageal reflux disease without esophagitis: Secondary | ICD-10-CM | POA: Diagnosis not present

## 2020-02-16 DIAGNOSIS — I10 Essential (primary) hypertension: Secondary | ICD-10-CM | POA: Diagnosis not present

## 2020-02-16 DIAGNOSIS — M1990 Unspecified osteoarthritis, unspecified site: Secondary | ICD-10-CM | POA: Diagnosis not present

## 2020-02-20 DIAGNOSIS — S6991XA Unspecified injury of right wrist, hand and finger(s), initial encounter: Secondary | ICD-10-CM | POA: Diagnosis not present

## 2020-02-20 DIAGNOSIS — M19011 Primary osteoarthritis, right shoulder: Secondary | ICD-10-CM | POA: Diagnosis not present

## 2020-02-20 DIAGNOSIS — M19041 Primary osteoarthritis, right hand: Secondary | ICD-10-CM | POA: Diagnosis not present

## 2020-02-21 DIAGNOSIS — Z23 Encounter for immunization: Secondary | ICD-10-CM | POA: Diagnosis not present

## 2020-02-21 DIAGNOSIS — D510 Vitamin B12 deficiency anemia due to intrinsic factor deficiency: Secondary | ICD-10-CM | POA: Diagnosis not present

## 2020-02-22 DIAGNOSIS — G20 Parkinson's disease: Secondary | ICD-10-CM | POA: Diagnosis not present

## 2020-03-09 DIAGNOSIS — G20 Parkinson's disease: Secondary | ICD-10-CM | POA: Diagnosis not present

## 2020-03-09 DIAGNOSIS — Z79899 Other long term (current) drug therapy: Secondary | ICD-10-CM | POA: Diagnosis not present

## 2020-03-09 DIAGNOSIS — Z5181 Encounter for therapeutic drug level monitoring: Secondary | ICD-10-CM | POA: Diagnosis not present

## 2020-03-17 DIAGNOSIS — M1990 Unspecified osteoarthritis, unspecified site: Secondary | ICD-10-CM | POA: Diagnosis not present

## 2020-03-17 DIAGNOSIS — K219 Gastro-esophageal reflux disease without esophagitis: Secondary | ICD-10-CM | POA: Diagnosis not present

## 2020-03-17 DIAGNOSIS — I10 Essential (primary) hypertension: Secondary | ICD-10-CM | POA: Diagnosis not present

## 2020-03-17 DIAGNOSIS — D519 Vitamin B12 deficiency anemia, unspecified: Secondary | ICD-10-CM | POA: Diagnosis not present

## 2020-03-19 DIAGNOSIS — I10 Essential (primary) hypertension: Secondary | ICD-10-CM | POA: Diagnosis not present

## 2020-03-19 DIAGNOSIS — K219 Gastro-esophageal reflux disease without esophagitis: Secondary | ICD-10-CM | POA: Diagnosis not present

## 2020-03-19 DIAGNOSIS — Z6825 Body mass index (BMI) 25.0-25.9, adult: Secondary | ICD-10-CM | POA: Diagnosis not present

## 2020-04-17 DIAGNOSIS — K219 Gastro-esophageal reflux disease without esophagitis: Secondary | ICD-10-CM | POA: Diagnosis not present

## 2020-04-17 DIAGNOSIS — I10 Essential (primary) hypertension: Secondary | ICD-10-CM | POA: Diagnosis not present

## 2020-04-17 DIAGNOSIS — D519 Vitamin B12 deficiency anemia, unspecified: Secondary | ICD-10-CM | POA: Diagnosis not present

## 2020-04-17 DIAGNOSIS — M1990 Unspecified osteoarthritis, unspecified site: Secondary | ICD-10-CM | POA: Diagnosis not present

## 2020-04-23 DIAGNOSIS — D510 Vitamin B12 deficiency anemia due to intrinsic factor deficiency: Secondary | ICD-10-CM | POA: Diagnosis not present

## 2020-05-17 DIAGNOSIS — I10 Essential (primary) hypertension: Secondary | ICD-10-CM | POA: Diagnosis not present

## 2020-05-17 DIAGNOSIS — F419 Anxiety disorder, unspecified: Secondary | ICD-10-CM | POA: Diagnosis not present

## 2020-05-17 DIAGNOSIS — K219 Gastro-esophageal reflux disease without esophagitis: Secondary | ICD-10-CM | POA: Diagnosis not present

## 2020-05-23 DIAGNOSIS — I10 Essential (primary) hypertension: Secondary | ICD-10-CM | POA: Diagnosis not present

## 2020-05-23 DIAGNOSIS — D510 Vitamin B12 deficiency anemia due to intrinsic factor deficiency: Secondary | ICD-10-CM | POA: Diagnosis not present

## 2020-05-23 DIAGNOSIS — Z6825 Body mass index (BMI) 25.0-25.9, adult: Secondary | ICD-10-CM | POA: Diagnosis not present

## 2020-05-23 DIAGNOSIS — R0789 Other chest pain: Secondary | ICD-10-CM | POA: Diagnosis not present

## 2020-05-24 ENCOUNTER — Other Ambulatory Visit: Payer: Self-pay

## 2020-05-24 ENCOUNTER — Encounter: Payer: Self-pay | Admitting: Cardiology

## 2020-05-24 ENCOUNTER — Ambulatory Visit: Payer: PPO | Admitting: Cardiology

## 2020-05-24 ENCOUNTER — Ambulatory Visit (INDEPENDENT_AMBULATORY_CARE_PROVIDER_SITE_OTHER): Payer: PPO

## 2020-05-24 ENCOUNTER — Telehealth: Payer: Self-pay | Admitting: Emergency Medicine

## 2020-05-24 VITALS — BP 126/70 | HR 70 | Ht 70.0 in | Wt 169.0 lb

## 2020-05-24 DIAGNOSIS — E785 Hyperlipidemia, unspecified: Secondary | ICD-10-CM

## 2020-05-24 DIAGNOSIS — R001 Bradycardia, unspecified: Secondary | ICD-10-CM

## 2020-05-24 DIAGNOSIS — R079 Chest pain, unspecified: Secondary | ICD-10-CM

## 2020-05-24 DIAGNOSIS — R0789 Other chest pain: Secondary | ICD-10-CM | POA: Insufficient documentation

## 2020-05-24 DIAGNOSIS — G20 Parkinson's disease: Secondary | ICD-10-CM | POA: Diagnosis not present

## 2020-05-24 DIAGNOSIS — R0600 Dyspnea, unspecified: Secondary | ICD-10-CM | POA: Diagnosis not present

## 2020-05-24 DIAGNOSIS — R0609 Other forms of dyspnea: Secondary | ICD-10-CM

## 2020-05-24 DIAGNOSIS — G20A1 Parkinson's disease without dyskinesia, without mention of fluctuations: Secondary | ICD-10-CM

## 2020-05-24 HISTORY — DX: Parkinson's disease without dyskinesia, without mention of fluctuations: G20.A1

## 2020-05-24 HISTORY — DX: Dyspnea, unspecified: R06.00

## 2020-05-24 HISTORY — DX: Other chest pain: R07.89

## 2020-05-24 HISTORY — DX: Hyperlipidemia, unspecified: E78.5

## 2020-05-24 HISTORY — DX: Parkinson's disease: G20

## 2020-05-24 HISTORY — DX: Other forms of dyspnea: R06.09

## 2020-05-24 MED ORDER — METOPROLOL TARTRATE 50 MG PO TABS: 50.0000 mg | ORAL_TABLET | Freq: Once | ORAL | 0 refills | Status: DC

## 2020-05-24 NOTE — Patient Instructions (Signed)
Medication Instructions:  Your physician recommends that you continue on your current medications as directed. Please refer to the Current Medication list given to you today.  *If you need a refill on your cardiac medications before your next appointment, please call your pharmacy*   Lab Work: None If you have labs (blood work) drawn today and your tests are completely normal, you will receive your results only by: Marland Kitchen MyChart Message (if you have MyChart) OR . A paper copy in the mail If you have any lab test that is abnormal or we need to change your treatment, we will call you to review the results.   Testing/Procedures: A zio monitor was ordered today. It will remain on for 7 days. You will then return monitor and event diary in provided box. It takes 1-2 weeks for report to be downloaded and returned to [place]. We will call you with the results. If monitor falls off or has orange flashing light, please call Zio for further instructions.    Your cardiac CT will be scheduled at one of the below locations:   [facility] [address] [place], [place] [address] [phone]  [facility] [address] [place], [place] [address] [phone]  If scheduled at [facility], please arrive at the [facility] main entrance of [facility] 30 minutes prior to test start time. Proceed to the [facility] Radiology Department (first floor) to check-in and test prep.  If scheduled at [facility], please arrive 15 mins early for check-in and test prep.  Please follow these instructions carefully (unless otherwise directed):  Hold all erectile dysfunction medications at least 3 days (72 hrs) prior to test.  On the Night Before the Test: . Be sure to Drink plenty of water. . Do not consume any caffeinated/decaffeinated beverages or chocolate 12 hours prior to your test. . Do not take any  antihistamines 12 hours prior to your test.   On the Day of the Test: . Drink plenty of water. Do not drink any water within one hour of the test. . Do not eat any food 4 hours prior to the test. . You may take your regular medications prior to the test.  . Take metoprolol (Lopressor) two hours prior to test.            After the Test: . Drink plenty of water. . After receiving IV contrast, you may experience a mild flushed feeling. This is normal. . On occasion, you may experience a mild rash up to 24 hours after the test. This is not dangerous. If this occurs, you can take Benadryl 25 mg and increase your fluid intake. . If you experience trouble breathing, this can be serious. If it is severe call 911 IMMEDIATELY. If it is mild, please call our office. . If you take any of these medications: Glipizide/Metformin, Avandament, Glucavance, please do not take 48 hours after completing test unless otherwise instructed.   Once we have confirmed authorization from your insurance company, we will call you to set up a date and time for your test. Based on how quickly your insurance processes prior authorizations requests, please allow up to 4 weeks to be contacted for scheduling your Cardiac CT appointment. Be advised that routine Cardiac CT appointments could be scheduled as many as 8 weeks after your provider has ordered it.  For non-scheduling related questions, please contact the cardiac imaging nurse navigator should  you have any questions/concerns: Marchia Bond, Cardiac Imaging Nurse Navigator Burley Saver, Interim Cardiac Imaging Nurse Navigator [facility] Direct Office Dial: [phone]   For scheduling needs, including cancellations and rescheduling, please call [place], [phone].    Follow-Up: At [facility], you and your health needs are our priority.  As part of our continuing mission to provide you with exceptional heart care, we have  created designated Provider Care Teams.  These Care Teams include your primary Cardiologist (physician) and Advanced Practice Providers (APPs -  Physician Assistants and Nurse Practitioners) who all work together to provide you with the care you need, when you need it.  We recommend signing up for the patient portal called "MyChart".  Sign up information is provided on this After Visit Summary.  MyChart is used to connect with patients for Virtual Visits (Telemedicine).  Patients are able to view lab/test results, encounter notes, upcoming appointments, etc.  Non-urgent messages can be sent to your provider as well.   To learn more about what you can do with MyChart, go to [email or web address].    Your next appointment:   2 month(s)  The format for your next appointment:   In Person  Provider:   Jenne Campus, MD   Other Instructions   Cardiac CT Angiogram A cardiac CT angiogram is a procedure to look at the heart and the area around the heart. It may be done to help find the cause of chest pains or other symptoms of heart disease. During this procedure, a substance called contrast dye is injected into the blood vessels in the area to be checked. A large X-ray machine, called a CT scanner, then takes detailed pictures of the heart and the surrounding area. The procedure is also sometimes called a coronary CT angiogram, coronary artery scanning, or CTA. A cardiac CT angiogram allows the health care provider to see how well blood is flowing to and from the heart. The health care provider will be able to see if there are any problems, such as:  Blockage or narrowing of the coronary arteries in the heart.  Fluid around the heart.  Signs of weakness or disease in the muscles, valves, and tissues of the heart. Tell a health care provider about:  Any allergies you have. This is especially important if you have had a previous allergic reaction to contrast dye.  All medicines you are  taking, including vitamins, herbs, eye drops, creams, and over-the-counter medicines.  Any blood disorders you have.  Any surgeries you have had.  Any medical conditions you have.  Whether you are pregnant or may be pregnant.  Any anxiety disorders, chronic pain, or other conditions you have that may increase your stress or prevent you from lying still. What are the risks? Generally, this is a safe procedure. However, problems may occur, including:  Bleeding.  Infection.  Allergic reactions to medicines or dyes.  Damage to other structures or organs.  Kidney damage from the contrast dye that is used.  Increased risk of cancer from radiation exposure. This risk is low. Talk with your health care provider about: ? The risks and benefits of testing. ? How you can receive the lowest dose of radiation. What happens before the procedure?  Wear comfortable clothing and remove any jewelry, glasses, dentures, and hearing aids.  Follow instructions from your health care provider about eating and drinking. This may include: ? For 12 hours before the procedure -- avoid caffeine. This includes tea, coffee, soda,  energy drinks, and diet pills. Drink plenty of water or other fluids that do not have caffeine in them. Being well hydrated can prevent complications. ? For 4-6 hours before the procedure -- stop eating and drinking. The contrast dye can cause nausea, but this is less likely if your stomach is empty.  Ask your health care provider about changing or stopping your regular medicines. This is especially important if you are taking diabetes medicines, blood thinners, or medicines to treat problems with erections (erectile dysfunction). What happens during the procedure?   Hair on your chest may need to be removed so that small sticky patches called electrodes can be placed on your chest. These will transmit information that helps to monitor your heart during the procedure.  An IV will  be inserted into one of your veins.  You might be given a medicine to control your heart rate during the procedure. This will help to ensure that good images are obtained.  You will be asked to lie on an exam table. This table will slide in and out of the CT machine during the procedure.  Contrast dye will be injected into the IV. You might feel warm, or you may get a metallic taste in your mouth.  You will be given a medicine called nitroglycerin. This will relax or dilate the arteries in your heart.  The table that you are lying on will move into the CT machine tunnel for the scan.  The person running the machine will give you instructions while the scans are being done. You may be asked to: ? Keep your arms above your head. ? Hold your breath. ? Stay very still, even if the table is moving.  When the scanning is complete, you will be moved out of the machine.  The IV will be removed. The procedure may vary among health care providers and hospitals. What can I expect after the procedure? After your procedure, it is common to have:  A metallic taste in your mouth from the contrast dye.  A feeling of warmth.  A headache from the nitroglycerin. Follow these instructions at home:  Take over-the-counter and prescription medicines only as told by your health care provider.  If you are told, drink enough fluid to keep your urine pale yellow. This will help to flush the contrast dye out of your body.  Most people can return to their normal activities right after the procedure. Ask your health care provider what activities are safe for you.  It is up to you to get the results of your procedure. Ask your health care provider, or the department that is doing the procedure, when your results will be ready.  Keep all follow-up visits as told by your health care provider. This is important. Contact a health care provider if:  You have any symptoms of allergy to the contrast dye. These  include: ? Shortness of breath. ? Rash or hives. ? A racing heartbeat. Summary  A cardiac CT angiogram is a procedure to look at the heart and the area around the heart. It may be done to help find the cause of chest pains or other symptoms of heart disease.  During this procedure, a large X-ray machine, called a CT scanner, takes detailed pictures of the heart and the surrounding area after a contrast dye has been injected into blood vessels in the area.  Ask your health care provider about changing or stopping your regular medicines before the procedure. This is especially  important if you are taking diabetes medicines, blood thinners, or medicines to treat erectile dysfunction.  If you are told, drink enough fluid to keep your urine pale yellow. This will help to flush the contrast dye out of your body. This information is not intended to replace advice given to you by your health care provider. Make sure you discuss any questions you have with your health care provider. Document Revised: 12/29/2018 Document Reviewed: 12/29/2018 Elsevier Patient Education  [place].

## 2020-05-24 NOTE — Telephone Encounter (Signed)
Called.

## 2020-05-24 NOTE — Progress Notes (Unsigned)
Cardiology Consultation:    Date:  05/24/2020   ID:  Matthew Morrow, DOB 1946/02/21, MRN [id number]  PCP:  Noni Saupe, MD  Cardiologist:  Gypsy Balsam, MD   Referring MD: No ref. provider found  Chief Complaint  Patient presents with  . Chest Pain    History of Present Illness:    Matthew Morrow is a 75 y.o. male who is being seen today for the evaluation of chest pain at the request of No ref. provider found.  He was referred to [place] by Dr. Gwendlyn Deutscher.  Apparently for last few months he has been experiencing some chest pain.  He described this as a burning-like sensation in the middle of the chest with going towards the left arm this sensation always happen at rest last for hours.  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  He does not have any of the sensation when he exercise.  He is worried and concerned about his because he does have multiple family members with coronary artery disease.  At the moment of my interview he is asymptomatic and quite cheerful and doing well overall. He never smoked He does have dyslipidemia being treated Very active and have no difficulty doing his activities. He does have resting bradycardia which is probably related to his activity overall.  He does have some dizziness however no syncope.  His past medical history is also significant for Parkinson's disease which seems to be very well managed  Past Medical History:  Diagnosis Date  . Hypertension   . Parkinson disease [facility])     Past Surgical History:  Procedure Laterality Date  . BILATERAL KNEE ARTHROSCOPY    . L RTC repaired    . Left hip ,fx repaired     . R shoulder replaced    . REPLACEMENT TOTAL KNEE BILATERAL      Current Medications: Current Meds  Medication Sig  . amLODipine (NORVASC) 5 MG tablet Take 5 mg by mouth every morning.  Marland Kitchen Apomorphine HCl  25 MG FILM Place 25 mg under the tongue as needed.  . carbidopa-levodopa (SINEMET CR) 50-200 MG tablet Take 1 tablet by mouth at bedtime.  . carbidopa-levodopa (SINEMET CR) 50-200 MG tablet Take 1 tablet by mouth at bedtime.  . Cyanocobalamin (VITAMIN B-12 IJ) Inject as directed every 30 (thirty) days.  . famotidine (PEPCID) 40 MG tablet Take 40 mg by mouth at bedtime. prn  . FLUoxetine (PROZAC) 40 MG capsule Take 80 mg by mouth every morning.  Marland Kitchen lisinopril (ZESTRIL) 40 MG tablet Take 40 mg by mouth every morning.  . Melatonin 10 MG TABS Take 1 tablet by mouth at bedtime.  . metoprolol tartrate (LOPRESSOR) 50 MG tablet Take 1 tablet (50 mg total) by mouth once for 1 dose. 2 hours before ct  . Omega-3 Fatty Acids (FISH OIL) 1000 MG CAPS Take 1 capsule by mouth every morning.  Marland Kitchen omeprazole (PRILOSEC) 40 MG capsule Take 40 mg by mouth every morning.  . prochlorperazine (COMPAZINE) 5 MG tablet Take 1 tablet by mouth as needed. Prior to taking Kynmobi  . tamsulosin (FLOMAX) 0.4 MG CAPS capsule Take 0.4 mg by mouth at bedtime.  . Vitamin D, Ergocalciferol, (DRISDOL) 1.25 MG (50000 UNIT) CAPS capsule Take 50,000 Units by mouth once a week.     Allergies:   Sulfa antibiotics  Social History   Socioeconomic History  . Marital status: Married    Spouse name: Not on file  . Number of children: Not on file  . Years of education: Not on file  . Highest education level: Not on file  Occupational History  . Not on file  Tobacco Use  . Smoking status: Never Smoker  . Smokeless tobacco: Never Used  Substance and Sexual Activity  . Alcohol use: Not Currently    Comment: Former   . Drug use: Never  . Sexual activity: Yes  Other Topics Concern  . Not on file  Social History Narrative  . Not on file   Social Determinants of Health   Financial Resource Strain: Not on file  Food Insecurity: Not on file  Transportation Needs: Not on file  Physical Activity: Not on file  Stress: Not on file   Social Connections: Not on file     Family History: The patient's family history includes Cancer in his paternal grandfather; Dementia in his paternal grandmother; Heart attack in his brother and father; Heart disease in his brother; Heart failure in his mother. ROS:   Please see the history of present illness.    All 14 point review of systems negative except as described per history of present illness.  EKGs/Labs/Other Studies Reviewed:    The following studies were reviewed today:   EKG:  EKG is  ordered today.  The ekg ordered today demonstrates sinus bradycardia, normal QS complex duration morphology, nonspecific ST segment changes.  Recent Labs: No results found for requested labs within last 8760 hours.  Recent Lipid Panel    Component Value Date/Time   CHOL (H) 07/08/2010 1132    217        ATP III CLASSIFICATION:  <200     mg/dL   Desirable  200-239  mg/dL   Borderline High  >=240    mg/dL   High          TRIG 278 (H) 07/08/2010 1132   HDL 34 (L) 07/08/2010 1132   CHOLHDL 6.4 07/08/2010 1132   VLDL 56 (H) 07/08/2010 1132   LDLCALC (H) 07/08/2010 1132    127        Total Cholesterol/HDL:CHD Risk Coronary Heart Disease Risk Table                     Men   Women  1/2 Average Risk   3.4   3.3  Average Risk       5.0   4.4  2 X Average Risk   9.6   7.1  3 X Average Risk  23.4   11.0        Use the calculated Patient Ratio above and the CHD Risk Table to determine the patient's CHD Risk.        ATP III CLASSIFICATION (LDL):  <100     mg/dL   Optimal  100-129  mg/dL   Near or Above                    Optimal  130-159  mg/dL   Borderline  160-189  mg/dL   High  >190     mg/dL   Very High    Physical Exam:    VS:  BP 126/70 (BP Location: Left Arm, Patient Position: Sitting)   Pulse 70   Ht 5\' 10"  (1.778 m)   Wt 169 lb (76.7 kg)   SpO2 96%   BMI  24.25 kg/m     Wt Readings from Last 3 Encounters:  05/24/20 169 lb (76.7 kg)     GEN:  Well nourished,  well developed in no acute distress HEENT: Normal NECK: No JVD; No carotid bruits LYMPHATICS: No lymphadenopathy CARDIAC: RRR, no murmurs, no rubs, no gallops RESPIRATORY:  Clear to auscultation without rales, wheezing or rhonchi  ABDOMEN: Soft, non-tender, non-distended MUSCULOSKELETAL:  No edema; No deformity  SKIN: Warm and dry NEUROLOGIC:  Alert and oriented x 3 PSYCHIATRIC:  Normal affect   ASSESSMENT:    1. Chest pain of uncertain etiology   2. Dyslipidemia   3. Parkinson's disease ([place])   4. Dyspnea on exertion   5. Bradycardia    PLAN:    In order of problems listed above:  1. Chest pain which look really very atypical happening at rest lasting for hours on top of that he is able to exercise aggressively with no difficulties.  I suspecting GI etiology of his symptomatology.  His wife was present with [place] during the visit tells me that he eats a lot of very spicy food.  He tells me that his taste is gone and is the only way that he can enjoy his eating.  He does have history of GERD. 2. I will schedule him however to do coronary CT angio of the purpose of this was to see if he had any significant coronary artery disease I did this mostly from prognostic point of view.  I will not alter any of his medication right now.  Doing coronary CT angio will allowed [place] also to look at his chest and rule out any other potential pathology that could be responsible for his symptoms.  However, if his work-up will be negative then GI work-up need to be done. 3. Dyspnea on exertion only minimal.  Again he exercise aggressively have no difficulty doing it. 4. Parkinson disease with some dementia overall he seems to be doing quite well from that aspect.   Medication Adjustments/Labs and Tests Ordered: Current medicines are reviewed at length with the patient today.  Concerns regarding medicines are outlined above.  Orders Placed This Encounter  Procedures  . CT CORONARY MORPH W/CTA COR W/SCORE  W/CA W/CM &/OR WO/CM  . CT CORONARY FRACTIONAL FLOW RESERVE DATA PREP  . CT CORONARY FRACTIONAL FLOW RESERVE FLUID ANALYSIS  . LONG TERM MONITOR (3-14 DAYS)  . EKG 12-Lead   Meds ordered this encounter  Medications  . metoprolol tartrate (LOPRESSOR) 50 MG tablet    Sig: Take 1 tablet (50 mg total) by mouth once for 1 dose. 2 hours before ct    Dispense:  1 tablet    Refill:  0    Signed, Park Liter, MD, [facility]. 05/24/2020 12:07 PM    [place]

## 2020-05-25 ENCOUNTER — Telehealth: Payer: Self-pay | Admitting: Cardiology

## 2020-05-25 NOTE — Telephone Encounter (Signed)
Per patient is not needing a refill, his refill will be deliver next Wednesday. I will disregard this request.

## 2020-05-25 NOTE — Telephone Encounter (Signed)
*  STAT* If patient is at the pharmacy, call can be transferred to refill team.   1. Which medications need to be refilled? (please list name of each medication and dose if known)  Metoprolol  2. Which pharmacy/location (including street and city if local pharmacy) is medication to be sent to? [facility]- please fax to [phone]  3. Do they need a 30 day or 90 day supply? 90 days and refills

## 2020-05-29 ENCOUNTER — Telehealth: Payer: Self-pay | Admitting: Cardiology

## 2020-05-29 NOTE — Telephone Encounter (Signed)
Called patient. I think answering service misunderstood. He doesn't need a refill on metoprolol it is only a one time medication before his ct. He states he does not need a refill. He will let us know if he needs anything further.

## 2020-05-29 NOTE — Telephone Encounter (Signed)
°*  STAT* If patient is at the pharmacy, call can be transferred to refill team. ? ? ?1. Which medications need to be refilled? (please list name of each medication and dose if known) Metoprolol ? ?2. Which pharmacy/location (including street and city if local pharmacy) is medication to be sent to?[facility] ? ?3. Do they need a 30 day or 90 day supply? 90 days and refills ? ?

## 2020-06-07 DIAGNOSIS — R001 Bradycardia, unspecified: Secondary | ICD-10-CM | POA: Diagnosis not present

## 2020-06-12 ENCOUNTER — Telehealth: Payer: Self-pay

## 2020-06-12 NOTE — Telephone Encounter (Signed)
Patient notified of the following per Dr. Agustin Cree and verbalized understanding.

## 2020-06-12 NOTE — Telephone Encounter (Signed)
-----   Message from Park Liter, MD sent at 06/12/2020  1:58 PM EST ----- Monitor shows some runs of nonsustained ventricular tachycardia no very slow note, will wait for the rest of the test that scheduled to determine significance of these findings

## 2020-06-14 DIAGNOSIS — D485 Neoplasm of uncertain behavior of skin: Secondary | ICD-10-CM | POA: Diagnosis not present

## 2020-06-14 DIAGNOSIS — D2239 Melanocytic nevi of other parts of face: Secondary | ICD-10-CM | POA: Diagnosis not present

## 2020-06-14 DIAGNOSIS — L814 Other melanin hyperpigmentation: Secondary | ICD-10-CM | POA: Diagnosis not present

## 2020-06-14 DIAGNOSIS — L570 Actinic keratosis: Secondary | ICD-10-CM | POA: Diagnosis not present

## 2020-06-17 DIAGNOSIS — D519 Vitamin B12 deficiency anemia, unspecified: Secondary | ICD-10-CM | POA: Diagnosis not present

## 2020-06-17 DIAGNOSIS — M1990 Unspecified osteoarthritis, unspecified site: Secondary | ICD-10-CM | POA: Diagnosis not present

## 2020-06-17 DIAGNOSIS — I10 Essential (primary) hypertension: Secondary | ICD-10-CM | POA: Diagnosis not present

## 2020-06-17 DIAGNOSIS — K219 Gastro-esophageal reflux disease without esophagitis: Secondary | ICD-10-CM | POA: Diagnosis not present

## 2020-06-21 ENCOUNTER — Other Ambulatory Visit: Payer: Self-pay | Admitting: *Deleted

## 2020-06-21 ENCOUNTER — Other Ambulatory Visit: Payer: Self-pay | Admitting: Cardiology

## 2020-06-21 DIAGNOSIS — R079 Chest pain, unspecified: Secondary | ICD-10-CM | POA: Diagnosis not present

## 2020-06-21 DIAGNOSIS — G20 Parkinson's disease: Secondary | ICD-10-CM

## 2020-06-21 NOTE — Progress Notes (Signed)
Pt came in today for BMP prior to getting Cardiac CT

## 2020-06-22 LAB — BASIC METABOLIC PANEL
BUN/Creatinine Ratio: 15 (ref 10–24)
BUN: 17 mg/dL (ref 8–27)
CO2: 25 mmol/L (ref 20–29)
Calcium: 9.3 mg/dL (ref 8.6–10.2)
Chloride: 104 mmol/L (ref 96–106)
Creatinine, Ser: 1.14 mg/dL (ref 0.76–1.27)
GFR calc Af Amer: 72 mL/min/{1.73_m2} (ref >59)
GFR calc non Af Amer: 62 mL/min/{1.73_m2} (ref >59)
Glucose: 99 mg/dL (ref 65–99)
Potassium: 4.4 mmol/L (ref 3.5–5.2)
Sodium: 140 mmol/L (ref 134–144)

## 2020-06-26 ENCOUNTER — Telehealth (HOSPITAL_COMMUNITY): Payer: Self-pay | Admitting: *Deleted

## 2020-06-26 NOTE — Telephone Encounter (Signed)
Reaching out to patient to offer assistance regarding upcoming cardiac imaging study; pt verbalizes understanding of appt date/time, parking situation and where to check in, pre-test NPO status, and verified current allergies; name and call back number provided for further questions should they arise  Araseli Sherry RN Navigator Cardiac Imaging [facility] [phone] office [phone] cell  

## 2020-06-27 ENCOUNTER — Encounter (HOSPITAL_COMMUNITY): Payer: Self-pay

## 2020-06-27 ENCOUNTER — Ambulatory Visit (HOSPITAL_COMMUNITY)
Admission: RE | Admit: 2020-06-27 | Discharge: 2020-06-27 | Disposition: A | Discharge status: Home / Self Care | Payer: PPO | Source: Ambulatory Visit | Attending: Cardiology | Admitting: Cardiology

## 2020-06-27 ENCOUNTER — Other Ambulatory Visit: Payer: Self-pay

## 2020-06-27 DIAGNOSIS — Z006 Encounter for examination for normal comparison and control in clinical research program: Secondary | ICD-10-CM

## 2020-06-27 DIAGNOSIS — I700 Atherosclerosis of aorta: Secondary | ICD-10-CM | POA: Diagnosis not present

## 2020-06-27 DIAGNOSIS — R079 Chest pain, unspecified: Secondary | ICD-10-CM | POA: Insufficient documentation

## 2020-06-27 MED ORDER — NITROGLYCERIN 0.4 MG SL SUBL
SUBLINGUAL_TABLET | SUBLINGUAL | Status: AC
  Filled 2020-06-27: qty 2

## 2020-06-27 MED ORDER — NITROGLYCERIN 0.4 MG SL SUBL: 0.8000 mg | SUBLINGUAL_TABLET | Freq: Once | SUBLINGUAL | Status: DC

## 2020-06-27 MED ORDER — IOHEXOL 350 MG/ML SOLN
80.0000 mL | Freq: Once | INTRAVENOUS | Status: AC | PRN
  Administered 2020-06-27: 80 mL via INTRAVENOUS

## 2020-06-27 NOTE — Research (Signed)
IDENTIFY Informed Consent                  Subject Name: Matthew Morrow   Subject met inclusion and exclusion criteria.  The informed consent form, study requirements and expectations were reviewed with the subject and questions and concerns were addressed prior to the signing of the consent form.  The subject verbalized understanding of the trial requirements.  The subject agreed to participate in the IDENTIFY trial and signed the informed consent at 08:24AM on 06/27/20.  The informed consent was obtained prior to performance of any protocol-specific procedures for the subject.  A copy of the signed informed consent was given to the subject and a copy was placed in the subject's medical record.   Meade Maw, Naval architect

## 2020-07-05 DIAGNOSIS — Z79899 Other long term (current) drug therapy: Secondary | ICD-10-CM | POA: Diagnosis not present

## 2020-07-05 DIAGNOSIS — G20 Parkinson's disease: Secondary | ICD-10-CM | POA: Diagnosis not present

## 2020-07-05 DIAGNOSIS — R292 Abnormal reflex: Secondary | ICD-10-CM | POA: Diagnosis not present

## 2020-07-05 DIAGNOSIS — R270 Ataxia, unspecified: Secondary | ICD-10-CM | POA: Diagnosis not present

## 2020-07-05 DIAGNOSIS — R519 Headache, unspecified: Secondary | ICD-10-CM | POA: Diagnosis not present

## 2020-07-16 DIAGNOSIS — D510 Vitamin B12 deficiency anemia due to intrinsic factor deficiency: Secondary | ICD-10-CM | POA: Diagnosis not present

## 2020-07-16 DIAGNOSIS — K219 Gastro-esophageal reflux disease without esophagitis: Secondary | ICD-10-CM | POA: Diagnosis not present

## 2020-07-16 DIAGNOSIS — I10 Essential (primary) hypertension: Secondary | ICD-10-CM | POA: Diagnosis not present

## 2020-07-16 DIAGNOSIS — M1990 Unspecified osteoarthritis, unspecified site: Secondary | ICD-10-CM | POA: Diagnosis not present

## 2020-07-30 DIAGNOSIS — M2578 Osteophyte, vertebrae: Secondary | ICD-10-CM | POA: Diagnosis not present

## 2020-07-30 DIAGNOSIS — R270 Ataxia, unspecified: Secondary | ICD-10-CM | POA: Diagnosis not present

## 2020-07-30 DIAGNOSIS — R292 Abnormal reflex: Secondary | ICD-10-CM | POA: Diagnosis not present

## 2020-07-30 DIAGNOSIS — M47812 Spondylosis without myelopathy or radiculopathy, cervical region: Secondary | ICD-10-CM | POA: Diagnosis not present

## 2020-07-30 DIAGNOSIS — R519 Headache, unspecified: Secondary | ICD-10-CM | POA: Diagnosis not present

## 2020-07-30 DIAGNOSIS — M4802 Spinal stenosis, cervical region: Secondary | ICD-10-CM | POA: Diagnosis not present

## 2020-08-03 ENCOUNTER — Other Ambulatory Visit: Payer: Self-pay

## 2020-08-03 DIAGNOSIS — R69 Illness, unspecified: Secondary | ICD-10-CM

## 2020-08-03 DIAGNOSIS — R6882 Decreased libido: Secondary | ICD-10-CM

## 2020-08-03 DIAGNOSIS — H903 Sensorineural hearing loss, bilateral: Secondary | ICD-10-CM

## 2020-08-03 DIAGNOSIS — M79671 Pain in right foot: Secondary | ICD-10-CM | POA: Insufficient documentation

## 2020-08-03 DIAGNOSIS — M25569 Pain in unspecified knee: Secondary | ICD-10-CM

## 2020-08-03 DIAGNOSIS — M1990 Unspecified osteoarthritis, unspecified site: Secondary | ICD-10-CM

## 2020-08-03 DIAGNOSIS — M1710 Unilateral primary osteoarthritis, unspecified knee: Secondary | ICD-10-CM | POA: Insufficient documentation

## 2020-08-03 DIAGNOSIS — E538 Deficiency of other specified B group vitamins: Secondary | ICD-10-CM

## 2020-08-03 DIAGNOSIS — N419 Inflammatory disease of prostate, unspecified: Secondary | ICD-10-CM

## 2020-08-03 DIAGNOSIS — Z96659 Presence of unspecified artificial knee joint: Secondary | ICD-10-CM

## 2020-08-03 DIAGNOSIS — D1800 Hemangioma unspecified site: Secondary | ICD-10-CM

## 2020-08-03 DIAGNOSIS — M545 Low back pain, unspecified: Secondary | ICD-10-CM

## 2020-08-03 DIAGNOSIS — N400 Enlarged prostate without lower urinary tract symptoms: Secondary | ICD-10-CM

## 2020-08-03 HISTORY — DX: Deficiency of other specified B group vitamins: E53.8

## 2020-08-03 HISTORY — DX: Benign prostatic hyperplasia without lower urinary tract symptoms: N40.0

## 2020-08-03 HISTORY — DX: Pain in unspecified knee: M25.569

## 2020-08-03 HISTORY — DX: Decreased libido: R68.82

## 2020-08-03 HISTORY — DX: Presence of unspecified artificial knee joint: Z96.659

## 2020-08-03 HISTORY — DX: Low back pain, unspecified: M54.50

## 2020-08-03 HISTORY — DX: Unilateral primary osteoarthritis, unspecified knee: M17.10

## 2020-08-03 HISTORY — DX: Unspecified osteoarthritis, unspecified site: M19.90

## 2020-08-03 HISTORY — DX: Hemangioma unspecified site: D18.00

## 2020-08-03 HISTORY — DX: Illness, unspecified: R69

## 2020-08-03 HISTORY — DX: Inflammatory disease of prostate, unspecified: N41.9

## 2020-08-03 HISTORY — DX: Sensorineural hearing loss, bilateral: H90.3

## 2020-08-06 ENCOUNTER — Ambulatory Visit (INDEPENDENT_AMBULATORY_CARE_PROVIDER_SITE_OTHER): Payer: PPO | Admitting: Cardiology

## 2020-08-06 ENCOUNTER — Other Ambulatory Visit: Payer: Self-pay

## 2020-08-06 ENCOUNTER — Encounter: Payer: Self-pay | Admitting: Cardiology

## 2020-08-06 VITALS — BP 98/60 | HR 73 | Ht 68.5 in | Wt 173.0 lb

## 2020-08-06 DIAGNOSIS — I2510 Atherosclerotic heart disease of native coronary artery without angina pectoris: Secondary | ICD-10-CM | POA: Diagnosis not present

## 2020-08-06 DIAGNOSIS — R0789 Other chest pain: Secondary | ICD-10-CM

## 2020-08-06 DIAGNOSIS — I10 Essential (primary) hypertension: Secondary | ICD-10-CM

## 2020-08-06 DIAGNOSIS — E785 Hyperlipidemia, unspecified: Secondary | ICD-10-CM

## 2020-08-06 DIAGNOSIS — G20 Parkinson's disease: Secondary | ICD-10-CM

## 2020-08-06 DIAGNOSIS — D510 Vitamin B12 deficiency anemia due to intrinsic factor deficiency: Secondary | ICD-10-CM | POA: Diagnosis not present

## 2020-08-06 NOTE — Patient Instructions (Signed)

## 2020-08-06 NOTE — Progress Notes (Signed)
Cardiology Office Note:    Date:  08/06/2020   ID:  Matthew Morrow, DOB 1946-04-21, MRN [id number]  PCP:  Angelina Sheriff, MD  Cardiologist:  Jenne Campus, MD    Referring MD: Angelina Sheriff, MD   Chief Complaint  Patient presents with  . Follow-up  I am doing fine  History of Present Illness:    Matthew Morrow is a 75 y.o. male referred to [place] initially because of atypical chest pain.  He is a avid cyclist and he right his bike almost daily.  Has never had any chest pain tightness squeezing pressure burning chest while riding a bike.  However he did have some chest pain with emotions.  I did coronary CT angio which showed nonobstructive RCA as well as nonobstructive LAD with minimal narrowing. He comes today 2 months of follow-up overall doing great he rode a bike yesterday with no difficulties, he pushed himself heart with no problems.  They plan to move to [place] to join her daughter  Past Medical History:  Diagnosis Date  . Abnormal involuntary movements 10/16/2013  . 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 Atypical chest pain 05/24/2020  . Benign prostatic hyperplasia 08/03/2020   Sep 09, 2010 Entered By: Crist Infante Comment: s/p TURP 2012  . 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 Decreased libido 08/03/2020  . Dyslipidemia 05/24/2020  . Dyspnea on exertion 05/24/2020  . 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 HTN (hypertension) 10/16/2013  . Hypertension   . 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 Low back pain 08/03/2020  . Mild cognitive impairment with memory loss 10/16/2013  . Osteoarthrosis, unspecified whether generalized or localized, other specified sites 08/03/2020  . Other ill-defined and unknown causes of morbidity and mortality 08/03/2020   Apr 30, 2012 Entered By: Crist Infante Comment: 04/30/12  . Pain in joint, lower leg 08/03/2020  . Parkinson disease ([place])   . Parkinson's disease ([place]) 05/24/2020  . Parkinsonian features 08/21/2015  . Primary  localized osteoarthrosis, lower leg 08/03/2020  . Prostatitis 08/03/2020  . Vitamin B12 deficiency 08/03/2020    Past Surgical History:  Procedure Laterality Date  . BILATERAL KNEE ARTHROSCOPY    . L RTC repaired    . Left hip ,fx repaired     . R shoulder replaced    . REPLACEMENT TOTAL KNEE BILATERAL      Current Medications: Current Meds  Medication Sig  . amLODipine (NORVASC) 10 MG tablet Take 1 tablet by mouth daily.  . carbidopa-levodopa (SINEMET CR) 50-200 MG tablet Take 1 tablet by mouth at bedtime.  . carbidopa-levodopa (SINEMET IR) 25-100 MG tablet Take 2 tablets by mouth 3 (three) times daily.  . Cholecalciferol 100 MCG (4000 UT) TABS Take 1 tablet by mouth daily.  . Cyanocobalamin (VITAMIN B-12 IJ) Inject as directed every 30 (thirty) days.  Marland Kitchen FLUoxetine (PROZAC) 40 MG capsule Take 80 mg by mouth every morning.  Marland Kitchen lisinopril (ZESTRIL) 40 MG tablet Take 40 mg by mouth every morning.  . tamsulosin (FLOMAX) 0.4 MG CAPS capsule Take 0.4 mg by mouth at bedtime.     Allergies:   Sulfa antibiotics and Sulfamethoxazole   Social History   Socioeconomic History  . Marital status: Married    Spouse name: Not on file  . Number of children: Not on file  . Years of education: Not on file  . Highest education level: Not on file  Occupational History  . Not on file  Tobacco Use  .  Smoking status: Never Smoker  . Smokeless tobacco: Never Used  Substance and Sexual Activity  . Alcohol use: Not Currently    Comment: Former   . Drug use: Never  . Sexual activity: Yes  Other Topics Concern  . Not on file  Social History Narrative  . Not on file   Social Determinants of Health   Financial Resource Strain: Not on file  Food Insecurity: Not on file  Transportation Needs: Not on file  Physical Activity: Not on file  Stress: Not on file  Social Connections: Not on file     Family History: The patient's family history includes Cancer in his paternal grandfather; Dementia  in his paternal grandmother; Heart attack in his brother and father; Heart disease in his brother; Heart failure in his mother. ROS:   Please see the history of present illness.    All 14 point review of systems negative except as described per history of present illness  EKGs/Labs/Other Studies Reviewed:      Recent Labs: 06/21/2020: BUN 17; Creatinine, Ser 1.14; Potassium 4.4; Sodium 140  Recent Lipid Panel    Component Value Date/Time   CHOL (H) 07/08/2010 1132    217        ATP III CLASSIFICATION:  <200     mg/dL   Desirable  200-239  mg/dL   Borderline High  >=240    mg/dL   High          TRIG 278 (H) 07/08/2010 1132   HDL 34 (L) 07/08/2010 1132   CHOLHDL 6.4 07/08/2010 1132   VLDL 56 (H) 07/08/2010 1132   LDLCALC (H) 07/08/2010 1132    127        Total Cholesterol/HDL:CHD Risk Coronary Heart Disease Risk Table                     Men   Women  1/2 Average Risk   3.4   3.3  Average Risk       5.0   4.4  2 X Average Risk   9.6   7.1  3 X Average Risk  23.4   11.0        Use the calculated Patient Ratio above and the CHD Risk Table to determine the patient's CHD Risk.        ATP III CLASSIFICATION (LDL):  <100     mg/dL   Optimal  100-129  mg/dL   Near or Above                    Optimal  130-159  mg/dL   Borderline  160-189  mg/dL   High  >190     mg/dL   Very High    Physical Exam:    VS:  BP 98/60 (BP Location: Right Arm, Patient Position: Sitting)   Pulse 73   Ht 5' 8.5" (1.74 m)   Wt 173 lb (78.5 kg)   SpO2 97%   BMI 25.92 kg/m     Wt Readings from Last 3 Encounters:  08/06/20 173 lb (78.5 kg)  05/24/20 169 lb (76.7 kg)     GEN:  Well nourished, well developed in no acute distress HEENT: Normal NECK: No JVD; No carotid bruits LYMPHATICS: No lymphadenopathy CARDIAC: RRR, no murmurs, no rubs, no gallops RESPIRATORY:  Clear to auscultation without rales, wheezing or rhonchi  ABDOMEN: Soft, non-tender, non-distended MUSCULOSKELETAL:  No edema; No  deformity  SKIN: Warm and dry LOWER EXTREMITIES: no  swelling NEUROLOGIC:  Alert and oriented x 3 PSYCHIATRIC:  Normal affect   ASSESSMENT:    1. Dyslipidemia   2. Coronary artery disease involving native coronary artery of native heart without angina pectoris   3. Parkinson's disease ([place])   4. Primary hypertension   5. Atypical chest pain    PLAN:    In order of problems listed above:  1. Dyslipidemia we will ask him to have fasting lipid profile done.  Anticipate need to start statin.  He does have some reservation because he read in some cycling magazine about bad effect of statin.  We will see what his cholesterol looks like before making decision. 2. Coronary disease nonobstructive based on coronary CT angio.  I advised to start taking 81 mg of aspirin every single day, will wait for results of fasting lipid profile before initiation of cholesterol-lowering therapy. 3. Parkinson's disease stable. 4. Essential hypertension blood pressure well controlled continue present management. 5. Atypical chest pain coronary CT angio negative.   Medication Adjustments/Labs and Tests Ordered: Current medicines are reviewed at length with the patient today.  Concerns regarding medicines are outlined above.  Orders Placed This Encounter  Procedures  . Lipid panel   Medication changes: No orders of the defined types were placed in this encounter.   Signed, Park Liter, MD, [facility] 08/06/2020 11:12 AM    [place]

## 2020-08-07 LAB — LIPID PANEL
Chol/HDL Ratio: 4.8 ratio (ref 0.0–5.0)
Cholesterol, Total: 195 mg/dL (ref 100–199)
HDL: 41 mg/dL (ref >39)
LDL Chol Calc (NIH): 122 mg/dL — ABNORMAL HIGH (ref 0–99)
Triglycerides: 180 mg/dL — ABNORMAL HIGH (ref 0–149)
VLDL Cholesterol Cal: 32 mg/dL (ref 5–40)

## 2020-08-08 ENCOUNTER — Telehealth: Payer: Self-pay | Admitting: Emergency Medicine

## 2020-08-08 ENCOUNTER — Telehealth: Payer: Self-pay | Admitting: Cardiology

## 2020-08-08 DIAGNOSIS — E785 Hyperlipidemia, unspecified: Secondary | ICD-10-CM

## 2020-08-08 MED ORDER — ROSUVASTATIN CALCIUM 5 MG PO TABS: 5.0000 mg | ORAL_TABLET | Freq: Every day | ORAL | 1 refills | Status: DC

## 2020-08-08 NOTE — Telephone Encounter (Signed)
-----   Message from Park Liter, MD sent at 08/08/2020 10:55 AM EDT ----- Cholesterol elevated, will be beneficial to be on statin.  I recommend to start Crestor 5 mg daily, fasting lipid profile need to be repeated within the next 6 weeks with AST ALT

## 2020-08-08 NOTE — Telephone Encounter (Signed)
Called patient informed him of results. He will start crestor 5 mg daily. Patient will repeat labs in 6 weeks. No further questions.

## 2020-08-08 NOTE — Telephone Encounter (Signed)
Left message for patient to return call.

## 2020-08-08 NOTE — Telephone Encounter (Signed)
Pt called in returning hayley call for results   Best number [phone]

## 2020-08-10 NOTE — Telephone Encounter (Signed)
Patient informed of results see encounter.

## 2020-08-15 DIAGNOSIS — R270 Ataxia, unspecified: Secondary | ICD-10-CM | POA: Diagnosis not present

## 2020-08-15 DIAGNOSIS — M1990 Unspecified osteoarthritis, unspecified site: Secondary | ICD-10-CM | POA: Diagnosis not present

## 2020-08-15 DIAGNOSIS — K219 Gastro-esophageal reflux disease without esophagitis: Secondary | ICD-10-CM | POA: Diagnosis not present

## 2020-08-15 DIAGNOSIS — G20 Parkinson's disease: Secondary | ICD-10-CM | POA: Diagnosis not present

## 2020-08-15 DIAGNOSIS — D510 Vitamin B12 deficiency anemia due to intrinsic factor deficiency: Secondary | ICD-10-CM | POA: Diagnosis not present

## 2020-08-15 DIAGNOSIS — R260 Ataxic gait: Secondary | ICD-10-CM | POA: Diagnosis not present

## 2020-08-15 DIAGNOSIS — R519 Headache, unspecified: Secondary | ICD-10-CM | POA: Diagnosis not present

## 2020-08-15 DIAGNOSIS — Z882 Allergy status to sulfonamides status: Secondary | ICD-10-CM | POA: Diagnosis not present

## 2020-08-15 DIAGNOSIS — M5481 Occipital neuralgia: Secondary | ICD-10-CM | POA: Diagnosis not present

## 2020-08-15 DIAGNOSIS — I10 Essential (primary) hypertension: Secondary | ICD-10-CM | POA: Diagnosis not present

## 2020-09-06 DIAGNOSIS — M10071 Idiopathic gout, right ankle and foot: Secondary | ICD-10-CM | POA: Diagnosis not present

## 2020-09-06 DIAGNOSIS — E538 Deficiency of other specified B group vitamins: Secondary | ICD-10-CM | POA: Diagnosis not present

## 2020-09-15 DIAGNOSIS — M1990 Unspecified osteoarthritis, unspecified site: Secondary | ICD-10-CM | POA: Diagnosis not present

## 2020-09-15 DIAGNOSIS — K219 Gastro-esophageal reflux disease without esophagitis: Secondary | ICD-10-CM | POA: Diagnosis not present

## 2020-09-15 DIAGNOSIS — I10 Essential (primary) hypertension: Secondary | ICD-10-CM | POA: Diagnosis not present

## 2020-09-15 DIAGNOSIS — D510 Vitamin B12 deficiency anemia due to intrinsic factor deficiency: Secondary | ICD-10-CM | POA: Diagnosis not present

## 2020-09-20 DIAGNOSIS — M19071 Primary osteoarthritis, right ankle and foot: Secondary | ICD-10-CM | POA: Diagnosis not present

## 2020-09-20 DIAGNOSIS — M10071 Idiopathic gout, right ankle and foot: Secondary | ICD-10-CM | POA: Diagnosis not present

## 2020-10-12 ENCOUNTER — Telehealth: Payer: Self-pay

## 2020-10-12 DIAGNOSIS — Z006 Encounter for examination for normal comparison and control in clinical research program: Secondary | ICD-10-CM

## 2020-10-12 NOTE — Telephone Encounter (Signed)
Called patient for 90 day Identify phone call no answer, I left a voicemail stating the intent of the phone call and our call back number to be reached in our department. 

## 2020-10-15 DIAGNOSIS — D510 Vitamin B12 deficiency anemia due to intrinsic factor deficiency: Secondary | ICD-10-CM | POA: Diagnosis not present

## 2020-10-15 DIAGNOSIS — M1990 Unspecified osteoarthritis, unspecified site: Secondary | ICD-10-CM | POA: Diagnosis not present

## 2020-10-15 DIAGNOSIS — K219 Gastro-esophageal reflux disease without esophagitis: Secondary | ICD-10-CM | POA: Diagnosis not present

## 2020-10-15 DIAGNOSIS — I10 Essential (primary) hypertension: Secondary | ICD-10-CM | POA: Diagnosis not present

## 2020-10-16 DIAGNOSIS — G43009 Migraine without aura, not intractable, without status migrainosus: Secondary | ICD-10-CM | POA: Diagnosis not present

## 2020-10-16 DIAGNOSIS — M1990 Unspecified osteoarthritis, unspecified site: Secondary | ICD-10-CM | POA: Diagnosis not present

## 2020-10-16 DIAGNOSIS — G43719 Chronic migraine without aura, intractable, without status migrainosus: Secondary | ICD-10-CM | POA: Diagnosis not present

## 2020-10-16 DIAGNOSIS — M5481 Occipital neuralgia: Secondary | ICD-10-CM | POA: Diagnosis not present

## 2020-10-16 DIAGNOSIS — G4486 Cervicogenic headache: Secondary | ICD-10-CM | POA: Diagnosis not present

## 2020-10-16 DIAGNOSIS — F4310 Post-traumatic stress disorder, unspecified: Secondary | ICD-10-CM | POA: Diagnosis not present

## 2020-10-16 DIAGNOSIS — Z5900 Homelessness unspecified: Secondary | ICD-10-CM

## 2020-10-16 DIAGNOSIS — F32A Depression, unspecified: Secondary | ICD-10-CM

## 2020-10-16 HISTORY — DX: Depression, unspecified: F32.A

## 2020-10-16 HISTORY — DX: Homelessness unspecified: Z59.00

## 2020-10-17 DIAGNOSIS — Z6826 Body mass index (BMI) 26.0-26.9, adult: Secondary | ICD-10-CM | POA: Diagnosis not present

## 2020-10-17 DIAGNOSIS — D510 Vitamin B12 deficiency anemia due to intrinsic factor deficiency: Secondary | ICD-10-CM | POA: Diagnosis not present

## 2020-10-17 DIAGNOSIS — H9122 Sudden idiopathic hearing loss, left ear: Secondary | ICD-10-CM | POA: Insufficient documentation

## 2020-10-23 ENCOUNTER — Telehealth: Payer: Self-pay | Admitting: *Deleted

## 2020-10-23 NOTE — Telephone Encounter (Signed)
I called patient for 90-Day Identify Study phone call. Patient is doing well with no cardiac symptoms. I reminded patient I would call him in February for 1-year follow-up.

## 2020-10-30 DIAGNOSIS — M10071 Idiopathic gout, right ankle and foot: Secondary | ICD-10-CM | POA: Insufficient documentation

## 2020-10-30 DIAGNOSIS — M19079 Primary osteoarthritis, unspecified ankle and foot: Secondary | ICD-10-CM | POA: Diagnosis not present

## 2020-11-12 DIAGNOSIS — D510 Vitamin B12 deficiency anemia due to intrinsic factor deficiency: Secondary | ICD-10-CM | POA: Diagnosis not present

## 2020-11-15 DIAGNOSIS — K219 Gastro-esophageal reflux disease without esophagitis: Secondary | ICD-10-CM | POA: Diagnosis not present

## 2020-11-15 DIAGNOSIS — I10 Essential (primary) hypertension: Secondary | ICD-10-CM | POA: Diagnosis not present

## 2020-11-15 DIAGNOSIS — D510 Vitamin B12 deficiency anemia due to intrinsic factor deficiency: Secondary | ICD-10-CM | POA: Diagnosis not present

## 2020-11-15 DIAGNOSIS — M1990 Unspecified osteoarthritis, unspecified site: Secondary | ICD-10-CM | POA: Diagnosis not present

## 2020-12-16 DIAGNOSIS — I10 Essential (primary) hypertension: Secondary | ICD-10-CM | POA: Diagnosis not present

## 2020-12-16 DIAGNOSIS — K219 Gastro-esophageal reflux disease without esophagitis: Secondary | ICD-10-CM | POA: Diagnosis not present

## 2020-12-16 DIAGNOSIS — M1990 Unspecified osteoarthritis, unspecified site: Secondary | ICD-10-CM | POA: Diagnosis not present

## 2020-12-16 DIAGNOSIS — D510 Vitamin B12 deficiency anemia due to intrinsic factor deficiency: Secondary | ICD-10-CM | POA: Diagnosis not present

## 2020-12-24 DIAGNOSIS — E538 Deficiency of other specified B group vitamins: Secondary | ICD-10-CM | POA: Diagnosis not present

## 2021-01-03 DIAGNOSIS — Z87828 Personal history of other (healed) physical injury and trauma: Secondary | ICD-10-CM | POA: Diagnosis not present

## 2021-01-03 DIAGNOSIS — F4310 Post-traumatic stress disorder, unspecified: Secondary | ICD-10-CM | POA: Diagnosis not present

## 2021-01-03 DIAGNOSIS — I10 Essential (primary) hypertension: Secondary | ICD-10-CM | POA: Diagnosis not present

## 2021-01-03 DIAGNOSIS — G4486 Cervicogenic headache: Secondary | ICD-10-CM | POA: Diagnosis not present

## 2021-01-03 DIAGNOSIS — Z79899 Other long term (current) drug therapy: Secondary | ICD-10-CM | POA: Diagnosis not present

## 2021-01-03 DIAGNOSIS — M5481 Occipital neuralgia: Secondary | ICD-10-CM | POA: Diagnosis not present

## 2021-01-03 DIAGNOSIS — G43009 Migraine without aura, not intractable, without status migrainosus: Secondary | ICD-10-CM | POA: Diagnosis not present

## 2021-01-03 DIAGNOSIS — G20 Parkinson's disease: Secondary | ICD-10-CM | POA: Diagnosis not present

## 2021-01-03 DIAGNOSIS — M1990 Unspecified osteoarthritis, unspecified site: Secondary | ICD-10-CM | POA: Diagnosis not present

## 2021-01-03 DIAGNOSIS — I2510 Atherosclerotic heart disease of native coronary artery without angina pectoris: Secondary | ICD-10-CM | POA: Diagnosis not present

## 2021-01-03 DIAGNOSIS — G4481 Hypnic headache: Secondary | ICD-10-CM

## 2021-01-03 HISTORY — DX: Hypnic headache: G44.81

## 2021-01-16 DIAGNOSIS — M1990 Unspecified osteoarthritis, unspecified site: Secondary | ICD-10-CM | POA: Diagnosis not present

## 2021-01-16 DIAGNOSIS — D519 Vitamin B12 deficiency anemia, unspecified: Secondary | ICD-10-CM | POA: Diagnosis not present

## 2021-01-16 DIAGNOSIS — K219 Gastro-esophageal reflux disease without esophagitis: Secondary | ICD-10-CM | POA: Diagnosis not present

## 2021-01-16 DIAGNOSIS — I10 Essential (primary) hypertension: Secondary | ICD-10-CM | POA: Diagnosis not present

## 2021-01-29 DIAGNOSIS — Z23 Encounter for immunization: Secondary | ICD-10-CM | POA: Diagnosis not present

## 2021-01-29 DIAGNOSIS — E538 Deficiency of other specified B group vitamins: Secondary | ICD-10-CM | POA: Diagnosis not present

## 2021-02-04 DIAGNOSIS — Z6825 Body mass index (BMI) 25.0-25.9, adult: Secondary | ICD-10-CM | POA: Diagnosis not present

## 2021-02-04 DIAGNOSIS — Z13220 Encounter for screening for lipoid disorders: Secondary | ICD-10-CM | POA: Diagnosis not present

## 2021-02-04 DIAGNOSIS — Z9181 History of falling: Secondary | ICD-10-CM | POA: Diagnosis not present

## 2021-02-04 DIAGNOSIS — G20 Parkinson's disease: Secondary | ICD-10-CM | POA: Diagnosis not present

## 2021-02-04 DIAGNOSIS — K219 Gastro-esophageal reflux disease without esophagitis: Secondary | ICD-10-CM | POA: Diagnosis not present

## 2021-02-04 DIAGNOSIS — M1990 Unspecified osteoarthritis, unspecified site: Secondary | ICD-10-CM | POA: Diagnosis not present

## 2021-02-04 DIAGNOSIS — I10 Essential (primary) hypertension: Secondary | ICD-10-CM | POA: Diagnosis not present

## 2021-02-04 DIAGNOSIS — F0280 Dementia in other diseases classified elsewhere without behavioral disturbance: Secondary | ICD-10-CM | POA: Diagnosis not present

## 2021-02-08 ENCOUNTER — Other Ambulatory Visit: Payer: Self-pay

## 2021-02-11 ENCOUNTER — Other Ambulatory Visit: Payer: Self-pay

## 2021-02-11 ENCOUNTER — Ambulatory Visit (INDEPENDENT_AMBULATORY_CARE_PROVIDER_SITE_OTHER): Payer: PPO | Admitting: Cardiology

## 2021-02-11 ENCOUNTER — Encounter: Payer: Self-pay | Admitting: Cardiology

## 2021-02-11 VITALS — BP 126/72 | HR 48 | Ht 69.5 in | Wt 171.6 lb

## 2021-02-11 DIAGNOSIS — R0600 Dyspnea, unspecified: Secondary | ICD-10-CM | POA: Diagnosis not present

## 2021-02-11 DIAGNOSIS — I2510 Atherosclerotic heart disease of native coronary artery without angina pectoris: Secondary | ICD-10-CM | POA: Diagnosis not present

## 2021-02-11 DIAGNOSIS — R0609 Other forms of dyspnea: Secondary | ICD-10-CM

## 2021-02-11 DIAGNOSIS — E785 Hyperlipidemia, unspecified: Secondary | ICD-10-CM | POA: Diagnosis not present

## 2021-02-11 DIAGNOSIS — I10 Essential (primary) hypertension: Secondary | ICD-10-CM | POA: Diagnosis not present

## 2021-02-11 MED ORDER — ROSUVASTATIN CALCIUM 5 MG PO TABS: 5.0000 mg | ORAL_TABLET | Freq: Every day | ORAL | 3 refills | Status: AC

## 2021-02-11 NOTE — Patient Instructions (Signed)

## 2021-02-11 NOTE — Progress Notes (Signed)
Cardiology Office Note:    Date:  02/11/2021   ID:  Matthew Morrow, DOB 03/27/1946, MRN [id number]  PCP:  Angelina Sheriff, MD  Cardiologist:  Jenne Campus, MD    Referring MD: Angelina Sheriff, MD   Chief Complaint  Patient presents with   Follow-up  I am doing well  History of Present Illness:    Matthew Morrow is a 75 y.o. male with past medical history significant for atypical chest pain.  In February 2022 we did coronary CT angio which showed calcium score of 97, he appears to have nonobstructive disease in RCA as well as LAD.  The other past medical history significant for Parkinson's, dyslipidemia.  He is supposed to move to [place] however because prices of housing became very high he did not do it yet.  He is still contemplating his move. He denies have any chest pain tightness squeezing pressure burning chest.  He is a very with bike rider and he rides his bicycle on the regular basis and he got no issues with that.  Denies have any symptoms.  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)    Parkinson's disease ([place]) 05/24/2020    Parkinsonian features 08/21/2015   Primary localized osteoarthrosis, lower leg 08/03/2020   Prostatitis 08/03/2020   Vitamin B12 deficiency 08/03/2020    Past Surgical History:  Procedure Laterality Date   BILATERAL KNEE ARTHROSCOPY     L RTC repaired     Left hip ,fx repaired      R shoulder replaced     REPLACEMENT TOTAL KNEE BILATERAL      Current Medications: Current Meds  Medication Sig   amLODipine (NORVASC) 10 MG tablet Take 1 tablet by mouth daily.   amLODipine (NORVASC) 5 MG tablet Take 5 mg by mouth every morning.   aspirin 81 MG EC tablet Take 1 tablet by mouth daily.   carbidopa-levodopa (SINEMET CR) 50-200 MG tablet Take 1 tablet by mouth at bedtime.   carbidopa-levodopa (SINEMET IR) 25-100 MG tablet Take 2 tablets by mouth 3 (three) times daily.   celecoxib (CELEBREX) 200 MG capsule Take 1 capsule by mouth 2 (two) times daily.   Cholecalciferol 100 MCG (4000 UT) TABS Take 1 tablet by mouth daily.   Cyanocobalamin (VITAMIN B-12 IJ) Inject 1 each as directed every 30 (thirty) days. Unknown strength   FLUoxetine (PROZAC) 40 MG capsule Take 80 mg by mouth every morning.   gabapentin (NEURONTIN) 300 MG capsule Take 1 capsule by mouth as needed for headache.   lisinopril (ZESTRIL) 40 MG tablet Take 40 mg by mouth every morning.   tamsulosin (FLOMAX) 0.4 MG  CAPS capsule Take 0.4 mg by mouth at bedtime.   Vitamin D, Ergocalciferol, (DRISDOL) 1.25 MG (50000 UNIT) CAPS capsule Take 50,000 Units by mouth once a week.   [DISCONTINUED] rosuvastatin (CRESTOR) 5 MG tablet Take 1 tablet (5 mg total) by mouth daily.     Allergies:   Sulfa antibiotics and Sulfamethoxazole   Social History   Socioeconomic History   Marital status: Married    Spouse name: Not on file   Number of children: Not on file   Years of education: Not on file   Highest education level: Not on file  Occupational History   Not on file  Tobacco Use   Smoking status: Never   Smokeless tobacco: Never   Substance and Sexual Activity   Alcohol use: Not Currently    Comment: Former    Drug use: Never   Sexual activity: Yes  Other Topics Concern   Not on file  Social History Narrative   Not on file   Social Determinants of Health   Financial Resource Strain: Not on file  Food Insecurity: Not on file  Transportation Needs: Not on file  Physical Activity: Not on file  Stress: Not on file  Social Connections: Not on file     Family History: The patient's family history includes Cancer in his paternal grandfather; Dementia in his paternal grandmother; Heart attack in his brother and father; Heart disease in his brother; Heart failure in his mother. ROS:   Please see the history of present illness.    All 14 point review of systems negative except as described per history of present illness  EKGs/Labs/Other Studies Reviewed:      Recent Labs: 06/21/2020: BUN 17; Creatinine, Ser 1.14; Potassium 4.4; Sodium 140  Recent Lipid Panel    Component Value Date/Time   CHOL 195 08/06/2020 1126   TRIG 180 (H) 08/06/2020 1126   HDL 41 08/06/2020 1126   CHOLHDL 4.8 08/06/2020 1126   CHOLHDL 6.4 07/08/2010 1132   VLDL 56 (H) 07/08/2010 1132   LDLCALC 122 (H) 08/06/2020 1126    Physical Exam:    VS:  BP 126/72 (BP Location: Right Arm, Patient Position: Sitting)   Pulse (!) 48   Ht 5' 9.5" (1.765 m)   Wt 171 lb 9.6 oz (77.8 kg)   SpO2 97%   BMI 24.98 kg/m     Wt Readings from Last 3 Encounters:  02/11/21 171 lb 9.6 oz (77.8 kg)  08/06/20 173 lb (78.5 kg)  05/24/20 169 lb (76.7 kg)     GEN:  Well nourished, well developed in no acute distress HEENT: Normal NECK: No JVD; No carotid bruits LYMPHATICS: No lymphadenopathy CARDIAC: RRR, no murmurs, no rubs, no gallops RESPIRATORY:  Clear to auscultation without rales, wheezing or rhonchi  ABDOMEN: Soft, non-tender, non-distended MUSCULOSKELETAL:  No edema; No deformity  SKIN: Warm and dry LOWER EXTREMITIES: no  swelling NEUROLOGIC:  Alert and oriented x 3 PSYCHIATRIC:  Normal affect   ASSESSMENT:    1. Arteriosclerosis of coronary artery   2. Primary hypertension   3. Dyslipidemia   4. Dyspnea on exertion    PLAN:    In order of problems listed above:  Coronary artery disease with luminal disease in LAD and RCA.  Asymptomatic.  I find out that he does not take his aspirin I stressed importance of taking this medication on the regular basis.  He also told me that he ran out of his Crestor.  We will give her a  new prescription with multiple refills. Dyslipidemia I did review his K PN which show me his LDL of 122 HDL 40 again this is without Crestor.  Luckily he was able to tolerate Crestor with no difficulties. Dyspnea on exertion denies having any. Essential hypertension blood pressure well controlled. Bradycardia to sinus, he is happy with writer high vagal tone is most likely responsible for it.  He denies have any dizziness or passing out   Medication Adjustments/Labs and Tests Ordered: Current medicines are reviewed at length with the patient today.  Concerns regarding medicines are outlined above.  No orders of the defined types were placed in this encounter.  Medication changes:  Meds ordered this encounter  Medications   rosuvastatin (CRESTOR) 5 MG tablet    Sig: Take 1 tablet (5 mg total) by mouth daily.    Dispense:  90 tablet    Refill:  3    Signed, Park Liter, MD, [facility] 02/11/2021 1:08 PM    [place]

## 2021-02-15 DIAGNOSIS — D510 Vitamin B12 deficiency anemia due to intrinsic factor deficiency: Secondary | ICD-10-CM | POA: Diagnosis not present

## 2021-02-15 DIAGNOSIS — I10 Essential (primary) hypertension: Secondary | ICD-10-CM | POA: Diagnosis not present

## 2021-02-15 DIAGNOSIS — F419 Anxiety disorder, unspecified: Secondary | ICD-10-CM | POA: Diagnosis not present

## 2021-02-19 DIAGNOSIS — R509 Fever, unspecified: Secondary | ICD-10-CM | POA: Diagnosis not present

## 2021-02-19 DIAGNOSIS — Z20828 Contact with and (suspected) exposure to other viral communicable diseases: Secondary | ICD-10-CM | POA: Diagnosis not present

## 2021-02-21 DIAGNOSIS — D510 Vitamin B12 deficiency anemia due to intrinsic factor deficiency: Secondary | ICD-10-CM | POA: Diagnosis not present

## 2021-02-21 DIAGNOSIS — Z6825 Body mass index (BMI) 25.0-25.9, adult: Secondary | ICD-10-CM | POA: Diagnosis not present

## 2021-02-21 DIAGNOSIS — Z20828 Contact with and (suspected) exposure to other viral communicable diseases: Secondary | ICD-10-CM | POA: Diagnosis not present

## 2021-02-21 DIAGNOSIS — R509 Fever, unspecified: Secondary | ICD-10-CM | POA: Diagnosis not present

## 2021-03-12 DIAGNOSIS — G4486 Cervicogenic headache: Secondary | ICD-10-CM | POA: Diagnosis not present

## 2021-03-18 DIAGNOSIS — I10 Essential (primary) hypertension: Secondary | ICD-10-CM | POA: Diagnosis not present

## 2021-03-18 DIAGNOSIS — D510 Vitamin B12 deficiency anemia due to intrinsic factor deficiency: Secondary | ICD-10-CM | POA: Diagnosis not present

## 2021-03-18 DIAGNOSIS — F419 Anxiety disorder, unspecified: Secondary | ICD-10-CM | POA: Diagnosis not present

## 2021-08-14 DIAGNOSIS — K219 Gastro-esophageal reflux disease without esophagitis: Secondary | ICD-10-CM | POA: Insufficient documentation

## 2021-08-14 DIAGNOSIS — Z8711 Personal history of peptic ulcer disease: Secondary | ICD-10-CM | POA: Insufficient documentation

## 2021-08-14 DIAGNOSIS — Z461 Encounter for fitting and adjustment of hearing aid: Secondary | ICD-10-CM

## 2021-08-14 HISTORY — DX: Encounter for fitting and adjustment of hearing aid: Z46.1

## 2021-08-14 HISTORY — DX: Personal history of peptic ulcer disease: Z87.11

## 2021-08-15 ENCOUNTER — Ambulatory Visit: Payer: PPO | Admitting: Cardiology

## 2021-11-27 IMAGING — CT CT HEART MORP W/ CTA COR W/ SCORE W/ CA W/CM &/OR W/O CM
4 of 7 series · 8 of 20 positions shown, 9 images · non-contrast
Comparison: None.
COMPARISON: None.

Addendum:
EXAM:
OVER-READ INTERPRETATION  CT CHEST

The following report is an over-read performed by radiologist Dr.
Wanerge Elejalde [REDACTED] on 06/27/2020. This
over-read does not include interpretation of cardiac or coronary
anatomy or pathology. The coronary calcium score/coronary CTA
interpretation by the cardiologist is attached.
CLINICAL DATA: Chest pain
Cardiac CTA
MEDICATIONS:
Sub lingual nitro. 4mg x 2
TECHNIQUE: The patient was scanned on a Siemens [REDACTED]ice scanner. Gantry
rotation speed was 250 msecs. Collimation was 0.6 mm. A 100 kV
prospective scan was triggered in the ascending thoracic aorta at
35-75% of the R-R interval. Average HR during the scan was 60 bpm.
The 3D data set was interpreted on a dedicated work station using
MPR, MIP and VRT modes. A total of 80cc of contrast was used.

[Series 6: best diast 71 % · axial · 0.43mm/px · z∈[+1062,+1105]mm · 2 of 323 slices shown]
[im 108/323  vessel]
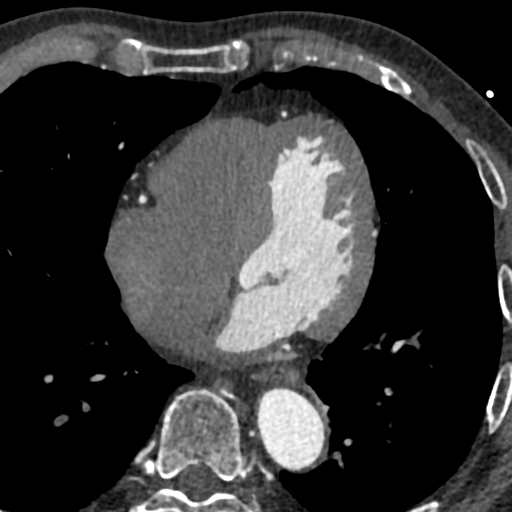
[im 215/323  vessel]
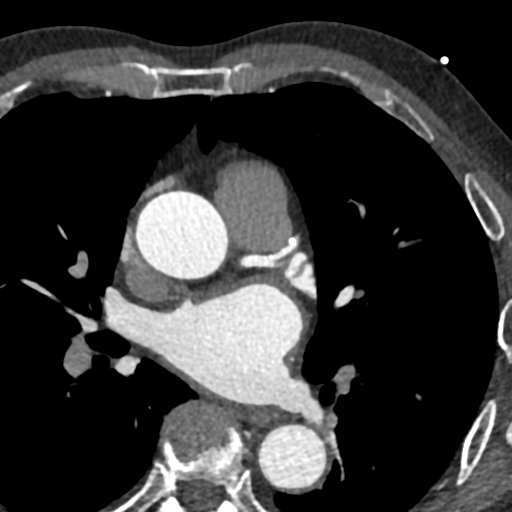

[Series 7: best syst · axial · 0.43mm/px · z∈[+1062,+1105]mm · 2 of 323 slices shown, 3 images]
[im 108/323  vessel]
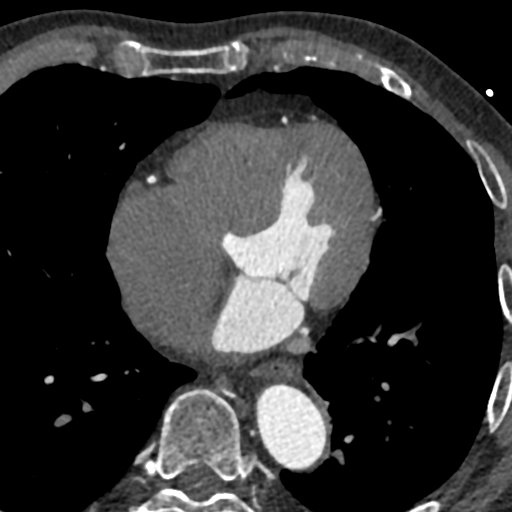
[im 108/323  lung]
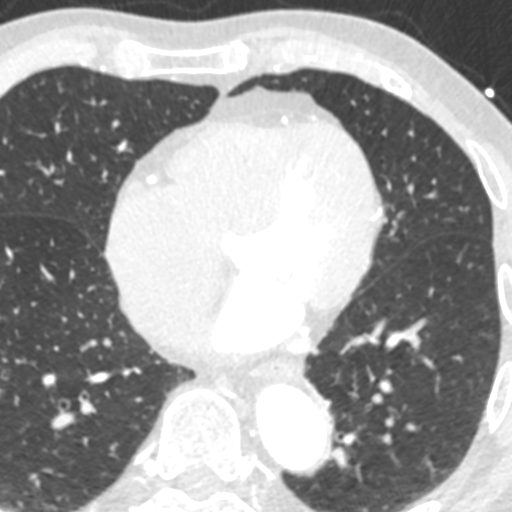
[im 215/323  vessel]
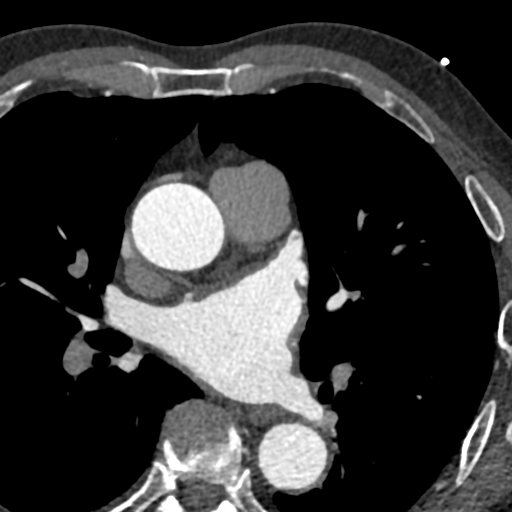

[Series 8: ts diast sharp 71 % · axial · 0.43mm/px · z∈[+1062,+1105]mm · 2 of 323 slices shown]
[im 108/323  lung]
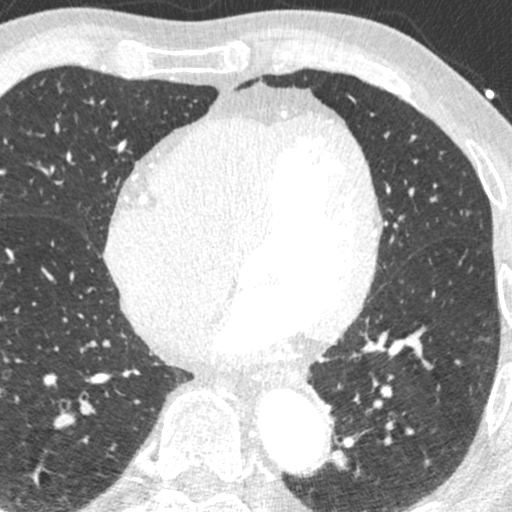
[im 215/323  lung]
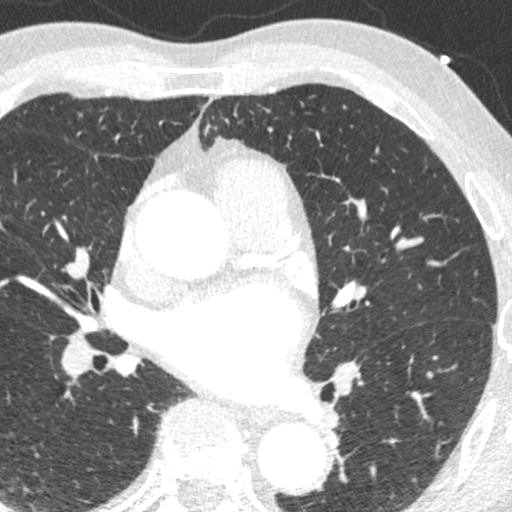

[Series 9: ts syst sharp 39 % · axial · 0.43mm/px · z∈[+1062,+1105]mm · 2 of 323 slices shown]
[im 108/323  lung]
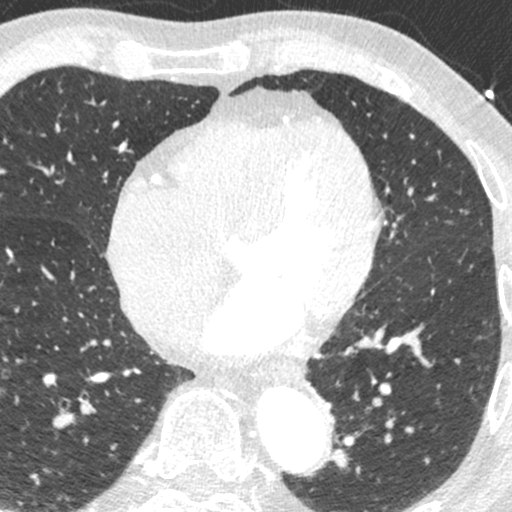
[im 215/323  lung]
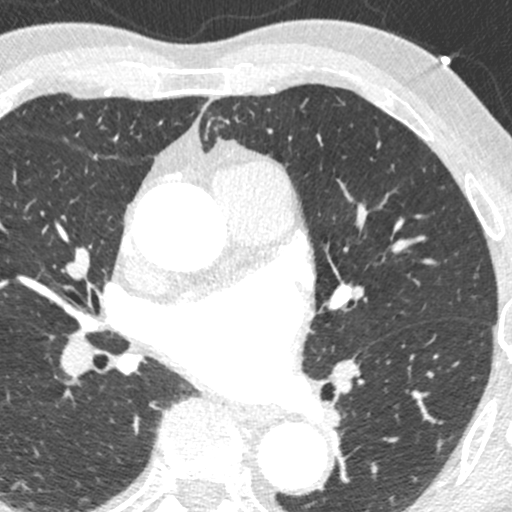

[8 of 20 positions shown; findings below may reference images not displayed]

FINDINGS: Aortic atherosclerosis. Within the visualized portions of the thorax
there are no suspicious appearing pulmonary nodules or masses, there
is no acute consolidative airspace disease, no pleural effusions, no
pneumothorax and no lymphadenopathy. Visualized portions of the
upper abdomen are unremarkable. There are no aggressive appearing
lytic or blastic lesions noted in the visualized portions of the
skeleton.
IMPRESSION: 1.  Aortic Atherosclerosis ([id number]-UO6.6).
FINDINGS: Non-cardiac: See separate report from [REDACTED].

The pulmonary veins drain normally to the left atrium. No LA
appendage thrombus.

Calcium Score: 95 Agatston units.

Coronary Arteries: Right dominant with no anomalies

LM: Calcified plaque distal LM, minimal stenosis.

LAD system: Mixed plaque proximally, mild (<50%) stenosis.

Circumflex system: No plaque or stenosis.

RCA system: Soft plaque ostial RCA, mild (<50%) stenosis. Calcified
plaque mid RCA, minimal stenosis.
IMPRESSION: 1. Coronary artery calcium score 97 Agatston units. This places the
patient in the 31st percentile for age and gender, suggesting low
risk for future cardiac events.

2. There appears to be nonobstructive disease in the RCA and the
LAD.

Alia Tiger

*** End of Addendum ***
EXAM:
OVER-READ INTERPRETATION  CT CHEST

The following report is an over-read performed by radiologist Dr.
Wanerge Elejalde [REDACTED] on 06/27/2020. This
over-read does not include interpretation of cardiac or coronary
anatomy or pathology. The coronary calcium score/coronary CTA
interpretation by the cardiologist is attached.
FINDINGS: Aortic atherosclerosis. Within the visualized portions of the thorax
there are no suspicious appearing pulmonary nodules or masses, there
is no acute consolidative airspace disease, no pleural effusions, no
pneumothorax and no lymphadenopathy. Visualized portions of the
upper abdomen are unremarkable. There are no aggressive appearing
lytic or blastic lesions noted in the visualized portions of the
skeleton.
IMPRESSION: 1.  Aortic Atherosclerosis ([id number]).
